# Patient Record
Sex: Female | Born: 1960
Health system: Southern US, Community
[De-identification: ages and names within clinical notes are randomized; demographics above are authoritative.]

## PROBLEM LIST (undated history)

## (undated) DIAGNOSIS — E119 Type 2 diabetes mellitus without complications: Secondary | ICD-10-CM

## (undated) DIAGNOSIS — F32A Depression, unspecified: Secondary | ICD-10-CM

## (undated) DIAGNOSIS — F329 Major depressive disorder, single episode, unspecified: Secondary | ICD-10-CM

## (undated) DIAGNOSIS — F419 Anxiety disorder, unspecified: Secondary | ICD-10-CM

---

## 2016-06-28 ENCOUNTER — Emergency Department
Admission: EM | Admit: 2016-06-28 | Discharge: 2016-06-29 | Disposition: A | Discharge status: Other Acute Inpatient Hospital | Payer: Self-pay | Attending: Emergency Medicine | Admitting: Emergency Medicine

## 2016-06-28 ENCOUNTER — Encounter: Payer: Self-pay | Admitting: Emergency Medicine

## 2016-06-28 DIAGNOSIS — R440 Auditory hallucinations: Secondary | ICD-10-CM

## 2016-06-28 DIAGNOSIS — F329 Major depressive disorder, single episode, unspecified: Secondary | ICD-10-CM | POA: Insufficient documentation

## 2016-06-28 DIAGNOSIS — F203 Undifferentiated schizophrenia: Secondary | ICD-10-CM

## 2016-06-28 DIAGNOSIS — Z79899 Other long term (current) drug therapy: Secondary | ICD-10-CM | POA: Insufficient documentation

## 2016-06-28 DIAGNOSIS — F29 Unspecified psychosis not due to a substance or known physiological condition: Secondary | ICD-10-CM

## 2016-06-28 HISTORY — DX: Major depressive disorder, single episode, unspecified: F32.9

## 2016-06-28 HISTORY — DX: Depression, unspecified: F32.A

## 2016-06-28 HISTORY — DX: Anxiety disorder, unspecified: F41.9

## 2016-06-28 LAB — SALICYLATE LEVEL: Salicylate Lvl: <4 mg/dL (ref 2.8–30.0)

## 2016-06-28 LAB — COMPREHENSIVE METABOLIC PANEL
ALBUMIN: 4.6 g/dL (ref 3.5–5.0)
ALK PHOS: 71 U/L (ref 38–126)
ALT: 39 U/L (ref 14–54)
AST: 30 U/L (ref 15–41)
Anion gap: 11 (ref 5–15)
BUN: 13 mg/dL (ref 6–20)
CALCIUM: 9.1 mg/dL (ref 8.9–10.3)
CO2: 23 mmol/L (ref 22–32)
CREATININE: 0.78 mg/dL (ref 0.44–1.00)
Chloride: 104 mmol/L (ref 101–111)
GFR calc Af Amer: >60 mL/min (ref >60)
GFR calc non Af Amer: >60 mL/min (ref >60)
GLUCOSE: 171 mg/dL — AB (ref 65–99)
Potassium: 3.9 mmol/L (ref 3.5–5.1)
SODIUM: 138 mmol/L (ref 135–145)
Total Bilirubin: 0.3 mg/dL (ref 0.3–1.2)
Total Protein: 8.2 g/dL — ABNORMAL HIGH (ref 6.5–8.1)

## 2016-06-28 LAB — URINE DRUG SCREEN, QUALITATIVE (ARMC ONLY)
Amphetamines, Ur Screen: NOT DETECTED
BARBITURATES, UR SCREEN: NOT DETECTED
Benzodiazepine, Ur Scrn: NOT DETECTED
CANNABINOID 50 NG, UR ~~LOC~~: NOT DETECTED
COCAINE METABOLITE, UR ~~LOC~~: NOT DETECTED
MDMA (Ecstasy)Ur Screen: NOT DETECTED
METHADONE SCREEN, URINE: NOT DETECTED
OPIATE, UR SCREEN: NOT DETECTED
Phencyclidine (PCP) Ur S: NOT DETECTED
Tricyclic, Ur Screen: POSITIVE — AB

## 2016-06-28 LAB — CBC
HEMATOCRIT: 39.9 % (ref 35.0–47.0)
HEMOGLOBIN: 13 g/dL (ref 12.0–16.0)
MCH: 26.9 pg (ref 26.0–34.0)
MCHC: 32.6 g/dL (ref 32.0–36.0)
MCV: 82.4 fL (ref 80.0–100.0)
Platelets: 249 10*3/uL (ref 150–440)
RBC: 4.84 MIL/uL (ref 3.80–5.20)
RDW: 14.1 % (ref 11.5–14.5)
WBC: 7.4 10*3/uL (ref 3.6–11.0)

## 2016-06-28 LAB — ACETAMINOPHEN LEVEL: Acetaminophen (Tylenol), Serum: <10 ug/mL — ABNORMAL LOW (ref 10–30)

## 2016-06-28 LAB — ETHANOL: Alcohol, Ethyl (B): <5 mg/dL (ref <5)

## 2016-06-28 MED ORDER — MIRTAZAPINE 15 MG PO TABS
30.0000 mg | ORAL_TABLET | Freq: Every day | ORAL | Status: DC
  Administered 2016-06-28: 30 mg via ORAL

## 2016-06-28 MED ORDER — QUETIAPINE FUMARATE 25 MG PO TABS
100.0000 mg | ORAL_TABLET | Freq: Three times a day (TID) | ORAL | Status: DC
  Administered 2016-06-28 – 2016-06-29 (×3): 100 mg via ORAL
  Filled 2016-06-28 (×2): qty 4

## 2016-06-28 MED ORDER — TRAZODONE HCL 100 MG PO TABS
100.0000 mg | ORAL_TABLET | Freq: Every day | ORAL | Status: DC
  Administered 2016-06-28: 100 mg via ORAL

## 2016-06-28 MED ORDER — FLUOXETINE HCL 20 MG PO CAPS
20.0000 mg | ORAL_CAPSULE | Freq: Every day | ORAL | Status: DC
  Administered 2016-06-29: 20 mg via ORAL
  Filled 2016-06-28: qty 1

## 2016-06-28 NOTE — ED Notes (Signed)

## 2016-06-28 NOTE — ED Provider Notes (Signed)
[facility] Emergency Department Provider Note   ____________________________________________  Time seen: Approximately 950 PM  I have reviewed the triage vital signs and the nursing notes.   HISTORY  Chief Complaint Paranoid   HPI Megan [name] is a 55 y.o. female with a history of anxiety and depression was present in the emergency department after being involuntarily [facility] for psychosis and hallucinations. The patient also had a suicide attempt in March when she tried to drink bleach. The patient says that she is also having command hallucinations that are telling her to kill herself. She says that she has been compliant with her medications.   Past Medical History  Diagnosis Date  . Anxiety   . Depression     There are no active problems to display for this patient.   History reviewed. No pertinent past surgical history.  No current outpatient prescriptions on file.  Allergies Sulfa antibiotics  No family history on file.  Social History Social History  Substance Use Topics  . Smoking status: Never Smoker   . Smokeless tobacco: None  . Alcohol Use: No    Review of Systems Constitutional: No fever/chills Eyes: No visual changes. ENT: No sore throat. Cardiovascular: Denies chest pain. Respiratory: Denies shortness of breath. Gastrointestinal: No abdominal pain.  No nausea, no vomiting.  No diarrhea.  No constipation. Genitourinary: Negative for dysuria. Musculoskeletal: Negative for back pain. Skin: Negative for rash. Neurological: Negative for headaches, focal weakness or numbness.  10-point ROS otherwise negative.  ____________________________________________   PHYSICAL EXAM:  VITAL SIGNS: ED Triage Vitals  Enc Vitals Group     BP 06/28/16 1901 152/90 mmHg     Pulse Rate 06/28/16 1901 90     Resp 06/28/16 1901 18     Temp --      Temp Source 06/28/16 1901 Oral     SpO2  06/28/16 1901 98 %     Weight 06/28/16 1901 176 lb (79.833 kg)     Height 06/28/16 1901 5\' 2"  (1.575 m)     Head Cir --      Peak Flow --      Pain Score 06/28/16 1902 0     Pain Loc --      Pain Edu? --      Excl. in GC? --     Constitutional: Alert and oriented. Well appearing and in no acute distress. Eyes: Conjunctivae are normal. PERRL. EOMI. Head: Atraumatic. Nose: No congestion/rhinnorhea. Mouth/Throat: Mucous membranes are moist.   Neck: No stridor.   Cardiovascular: Normal rate, regular rhythm. Grossly normal heart sounds.  Respiratory: Normal respiratory effort.  No retractions. Lungs CTAB. Gastrointestinal: Soft and nontender. No distention. No abdominal bruits. No CVA tenderness. Musculoskeletal: No lower extremity tenderness nor edema.  No joint effusions. Neurologic:  Normal speech and language. No gross focal neurologic deficits are appreciated.  Skin:  Skin is warm, dry and intact. No rash noted. Psychiatric: Mood and affect are normal. Speech and behavior are normal.  ____________________________________________   LABS (all labs ordered are listed, but only abnormal results are displayed)  Labs Reviewed  COMPREHENSIVE METABOLIC PANEL - Abnormal; Notable for the following:    Glucose, Bld 171 (*)    Total Protein 8.2 (*)    All other components within normal limits  ACETAMINOPHEN LEVEL - Abnormal; Notable for the following:    Acetaminophen (Tylenol), Serum <10 (*)    All other components within normal limits  URINE DRUG  SCREEN, QUALITATIVE ([facility] ONLY) - Abnormal; Notable for the following:    Tricyclic, Ur Screen POSITIVE (*)    All other components within normal limits  ETHANOL  SALICYLATE LEVEL  CBC   ____________________________________________  EKG   ____________________________________________  RADIOLOGY   ____________________________________________   PROCEDURES  ____________________________________________   INITIAL IMPRESSION /  ASSESSMENT AND PLAN / ED COURSE  Pertinent labs & imaging results that were available during my care of the patient were reviewed by me and considered in my medical decision making (see chart for details).  We will uphold involuntary commitment. Psychiatry to see. The patient is aware that she is committed and will need to stay overnight for psychiatry consult. ____________________________________________   FINAL CLINICAL IMPRESSION(S) / ED DIAGNOSES  Psychosis. Hallucinations.    NEW MEDICATIONS STARTED DURING THIS VISIT:  New Prescriptions   No medications on file     Note:  This document was prepared using Dragon voice recognition software and may include unintentional dictation errors.    Myrna Blazeravid Matthew Ladashia Demarinis, MD 06/28/16 2209

## 2016-06-28 NOTE — ED Notes (Addendum)
Arrives with [employer] under IVC paperwork.  [place] [facility].  States that people here in [place] are against her.  Patient is paranoid.  Denies SI/ HI

## 2016-06-28 NOTE — ED Notes (Signed)
Report to Margaret, RN in ED BHU.  

## 2016-06-28 NOTE — ED Notes (Signed)
BEHAVIORAL HEALTH ROUNDING  Patient sleeping: No.  Patient alert and oriented: yes  Behavior appropriate: Yes. ; If no, describe:  Nutrition and fluids offered: Yes  Toileting and hygiene offered: Yes  Sitter present: not applicable, Q 15 min safety rounds and observation.  Law enforcement present: Yes ODS  

## 2016-06-28 NOTE — ED Notes (Signed)
BEHAVIORAL HEALTH ROUNDING Patient sleeping: Yes.   Patient alert and oriented: not applicable SLEEPING Behavior appropriate: Yes.  ; If no, describe: SLEEPING Nutrition and fluids offered: No SLEEPING Toileting and hygiene offered: NoSLEEPING Sitter present: not applicable, Q 15 min safety rounds and observation. Law enforcement present: Yes ODS 
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
## 2016-06-28 NOTE — ED Notes (Signed)
Pt given home meds per MD Schaevitz.

## 2016-06-29 ENCOUNTER — Inpatient Hospital Stay
Admission: AD | Admit: 2016-06-29 | Discharge: 2016-07-05 | DRG: 885 | Disposition: A | Discharge status: Home / Self Care | Payer: No Typology Code available for payment source | Source: Ambulatory Visit | Attending: Psychiatry | Admitting: Psychiatry

## 2016-06-29 DIAGNOSIS — Z882 Allergy status to sulfonamides status: Secondary | ICD-10-CM

## 2016-06-29 DIAGNOSIS — F203 Undifferentiated schizophrenia: Secondary | ICD-10-CM | POA: Diagnosis not present

## 2016-06-29 DIAGNOSIS — Z79899 Other long term (current) drug therapy: Secondary | ICD-10-CM | POA: Diagnosis not present

## 2016-06-29 DIAGNOSIS — F22 Delusional disorders: Secondary | ICD-10-CM

## 2016-06-29 DIAGNOSIS — F4310 Post-traumatic stress disorder, unspecified: Secondary | ICD-10-CM | POA: Diagnosis present

## 2016-06-29 DIAGNOSIS — G4700 Insomnia, unspecified: Secondary | ICD-10-CM | POA: Diagnosis present

## 2016-06-29 DIAGNOSIS — Z9119 Patient's noncompliance with other medical treatment and regimen: Secondary | ICD-10-CM | POA: Diagnosis not present

## 2016-06-29 DIAGNOSIS — Z91419 Personal history of unspecified adult abuse: Secondary | ICD-10-CM | POA: Diagnosis not present

## 2016-06-29 DIAGNOSIS — F17200 Nicotine dependence, unspecified, uncomplicated: Secondary | ICD-10-CM | POA: Diagnosis present

## 2016-06-29 DIAGNOSIS — R45851 Suicidal ideations: Secondary | ICD-10-CM | POA: Diagnosis present

## 2016-06-29 DIAGNOSIS — Z915 Personal history of self-harm: Secondary | ICD-10-CM | POA: Diagnosis not present

## 2016-06-29 DIAGNOSIS — Z62810 Personal history of physical and sexual abuse in childhood: Secondary | ICD-10-CM | POA: Diagnosis present

## 2016-06-29 LAB — LIPID PANEL
Cholesterol: 255 mg/dL — ABNORMAL HIGH (ref 0–200)
HDL: 66 mg/dL (ref >40)
LDL CALC: 115 mg/dL — AB (ref 0–99)
TRIGLYCERIDES: 369 mg/dL — AB (ref <150)
Total CHOL/HDL Ratio: 3.9 RATIO
VLDL: 74 mg/dL — AB (ref 0–40)

## 2016-06-29 LAB — TSH: TSH: 0.275 u[IU]/mL — ABNORMAL LOW (ref 0.350–4.500)

## 2016-06-29 MED ORDER — TRAZODONE HCL 50 MG PO TABS
150.0000 mg | ORAL_TABLET | Freq: Every day | ORAL | Status: DC
  Administered 2016-06-29 – 2016-07-04 (×6): 150 mg via ORAL
  Filled 2016-06-29 (×6): qty 1

## 2016-06-29 MED ORDER — NICOTINE 21 MG/24HR TD PT24
21.0000 mg | MEDICATED_PATCH | Freq: Every day | TRANSDERMAL | Status: DC
  Administered 2016-06-30 (×2): 21 mg via TRANSDERMAL
  Filled 2016-06-29 (×4): qty 1

## 2016-06-29 MED ORDER — BISACODYL 10 MG RE SUPP
10.0000 mg | Freq: Once | RECTAL | Status: AC
  Administered 2016-06-29: 10 mg via RECTAL
  Filled 2016-06-29: qty 1

## 2016-06-29 MED ORDER — FLUOXETINE HCL 20 MG PO CAPS
20.0000 mg | ORAL_CAPSULE | Freq: Every day | ORAL | Status: DC
  Administered 2016-06-30 – 2016-07-01 (×2): 20 mg via ORAL
  Filled 2016-06-29 (×2): qty 1

## 2016-06-29 MED ORDER — NICOTINE 21 MG/24HR TD PT24
21.0000 mg | MEDICATED_PATCH | Freq: Every day | TRANSDERMAL | Status: DC
  Administered 2016-06-29: 21 mg via TRANSDERMAL
  Filled 2016-06-29: qty 1

## 2016-06-29 MED ORDER — ALUM & MAG HYDROXIDE-SIMETH 200-200-20 MG/5ML PO SUSP: 30.0000 mL | ORAL | Status: DC | PRN

## 2016-06-29 MED ORDER — PALIPERIDONE ER 3 MG PO TB24
9.0000 mg | ORAL_TABLET | Freq: Every day | ORAL | Status: DC
  Administered 2016-06-29 – 2016-07-04 (×6): 9 mg via ORAL
  Filled 2016-06-29 (×6): qty 3

## 2016-06-29 MED ORDER — ACETAMINOPHEN 325 MG PO TABS
650.0000 mg | ORAL_TABLET | Freq: Four times a day (QID) | ORAL | Status: DC | PRN
  Administered 2016-07-01: 650 mg via ORAL
  Filled 2016-06-29: qty 2

## 2016-06-29 MED ORDER — MAGNESIUM HYDROXIDE 400 MG/5ML PO SUSP: 30.0000 mL | Freq: Every day | ORAL | Status: DC | PRN

## 2016-06-29 NOTE — ED Notes (Signed)
ED BHU PLACEMENT JUSTIFICATION Is the patient under IVC or is there intent for IVC: Yes.   Is the patient medically cleared: Yes.   Is there vacancy in the ED BHU: Yes.   Is the population mix appropriate for patient: Yes.   Is the patient awaiting placement in inpatient or outpatient setting: Yes.   Has the patient had a psychiatric consult: Yes.   Survey of unit performed for contraband, proper placement and condition of furniture, tampering with fixtures in bathroom, shower, and each patient room: Yes.   APPEARANCE/BEHAVIOR calm, cooperative and adequate rapport can be established NEURO ASSESSMENT Orientation: place and person Hallucinations: Yes.  None noted (Hallucinations) Speech: Normal Gait: normal RESPIRATORY ASSESSMENT Normal expansion.  Clear to auscultation.  No rales, rhonchi, or wheezing. CARDIOVASCULAR ASSESSMENT regular rate and rhythm, S1, S2 normal, no murmur, click, rub or gallop GASTROINTESTINAL ASSESSMENT soft, nontender, BS WNL, no r/g EXTREMITIES normal strength, tone, and muscle mass PLAN OF CARE Provide calm/safe environment. Vital signs assessed twice daily. ED BHU Assessment once each 12-hour shift. Collaborate with intake RN daily or as condition indicates. Assure the ED provider has rounded once each shift. Provide and encourage hygiene. Provide redirection as needed. Assess for escalating behavior; address immediately and inform ED provider.  Assess family dynamic and appropriateness for visitation as needed: Yes.   Educate the patient/family about BHU procedures/visitation: Yes.

## 2016-06-29 NOTE — ED Notes (Signed)
Dr. Ardyth HarpsHernandez is talking with patient.

## 2016-06-29 NOTE — ED Notes (Signed)
Report was received from Dorise HissElizabeth C., RN; Pt. Verbalizes no complaints or distress; verbalizes having S.I.; denies having Hi. Presents with being paranoid; anxious; and depressed; and has been off her medications; secondary to, not being able to afford them; was referred here under IVC; per [facility]. Continue to monitor with 15 min. Monitoring.
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
## 2016-06-29 NOTE — ED Provider Notes (Signed)
-----------------------------------------   6:43 AM on 06/29/2016 -----------------------------------------   Blood pressure 128/90, pulse 95, temperature 98.8 F (37.1 C), temperature source Oral, resp. rate 18, height 5\' 2"  (1.575 m), weight 79.833 kg, last menstrual period 03/28/2016, SpO2 98 %.  The patient had no acute events since last update.  Calm and cooperative at this time.  Disposition is pending per Psychiatry/Behavioral Medicine team recommendations.     Loleta Roseory Jasmond River, MD 06/29/16 [phone]

## 2016-06-29 NOTE — ED Notes (Addendum)
Patient is alert, she ask for more food, nurse did give her a sandwich, and drink when she got her medications. Patient denies Si/Hi or avh. Patient is safe, no evidence of distress at this time. Camera surveillance in progress.

## 2016-06-29 NOTE — Progress Notes (Signed)
D: Pt denies SI/HI/AVH, Pt is pleasant and cooperative, affect is flat, but brightens upon approach.Pt appears less anxious and she is interacting with peers and staff appropriately.  A: Pt was offered support and encouragement. Pt was given scheduled medications. Pt was encouraged to attend groups. Q 15 minute checks were done for safety.  R:Pt attends groups and interacts well with peers and staff. Pt is taking medication. Pt has no complaints.Pt receptive to treatment and safety maintained on unit.

## 2016-06-29 NOTE — ED Notes (Signed)
Patient is alert, she has rapid speech , but is pleasant, denies Si.Hi and AVH. Patient is watching tv.

## 2016-06-29 NOTE — Progress Notes (Signed)
[facility] by Dr. Ardyth HarpsHernandez.  Attending Physician will be Dr. Ardyth HarpsHernandez.   Patient has [facility] room 304[facility]A, [facility] [place].   Intake Paper Work has been signed and placed on patient chart.  ER staff is aware of the admission Glendon Axe( [facility] Sect.; Dr. Fanny BienQuale, ER MD; Toniann FailWendy Patient's Nurse & Marliss CzarLeigh Patient Access).

## 2016-06-29 NOTE — ED Notes (Signed)
Called report to Mayra Neerliff parker RN , [facility]

## 2016-06-29 NOTE — [facility] Group Notes (Signed)
[facility] Group Notes:  (Nursing/MHT/Case Management/Adjunct)  Date:  06/29/2016  Time:  9:59 PM  Type of Therapy:  Evening Wrap-up Group  Participation Level:  Minimal  Participation Quality:  Appropriate and Attentive  Affect:  Appropriate  Cognitive:  Alert and Appropriate  Insight:  Good  Engagement in Group:  Developing/Improving and Engaged  Modes of Intervention:  Discussion  Summary of Progress/Problems:  Tomasita MorrowChelsea Nanta Laruth Hanger 06/29/2016, 9:59 PM

## 2016-06-29 NOTE — ED Notes (Signed)
Patient with discharge orders, [facility], patient voices understanding of transfer, patient transferred via w/c with police custody and nurse, Patient's belongings taken with patient.

## 2016-06-29 NOTE — ED Notes (Signed)
Patient had ask for sandwich, and her medications, nurse did administered medications earlier from scheduled time, patient is alert, but paranoid, talking about how people are stalking her and causing her not to be able to get a job, and how the government had put chemicals under her house at one time, she is pleasant to staff, no hostility noted, and is cooperative, paranoia is all about work, [employer] and people she use to work with and how she is taking them to court. Patient denies Si/HI, but states she does hear voices at times, but not at this present moment. Patient is safe, q 15 min. Checks, and camera surveillance in progress.

## 2016-06-29 NOTE — Progress Notes (Addendum)
Pt was admitted to beh. Med. With the Dx. Of Delusional DO. According to report: Pt presented to the  emergency department, talking about how people are stalking her and causing her not to be able to get a job, and how the government had put chemicals under her house at one time. patient had reported being off her medications due to inability to afford them. Patient says she has been having auditory hallucinations that tell her she is a bad mother, that she cannot take care of herself and that she is no good. She feels the devil is the one talking to her. She also sees black shadows sometimes. The patient has been having thoughts about killing herself but denies having any intention to do it. She did  however take an overdose of her medications about 7 days ago. Patient  states  she took 2 bottles of her medications 7 days ago in a suicidal attempt. Pt was pleasant and cooperative doing the 1:!. Pt denies SI and A/V hallucinations, but does endorse having them in the past. Pt is able to contract for safety. Pt. Talked about the events leading to hospitalization.

## 2016-06-29 NOTE — ED Notes (Signed)
Patient had ask for suppository for constipation, MD doctor did order, patient is alert, denies Si/Hi or avh at this time, camera surveillance and q 15 min. Checks.

## 2016-06-29 NOTE — Tx Team (Signed)
Initial Interdisciplinary Treatment Plan   PATIENT STRESSORS: Financial difficulties Medication change or noncompliance   PATIENT STRENGTHS: Motivation for treatment/growth Supportive family/friends   PROBLEM LIST: Problem List/Patient Goals Date to be addressed Date deferred Reason deferred Estimated date of resolution  depression 06/29/16     Suicidal ideations 06/29/16                                                DISCHARGE CRITERIA:  Ability to meet basic life and health needs Improved stabilization in mood, thinking, and/or behavior Motivation to continue treatment in a less acute level of care  PRELIMINARY DISCHARGE PLAN: Attend aftercare/continuing care group Outpatient therapy  PATIENT/FAMIILY INVOLVEMENT: This treatment plan has been presented to and reviewed with the patient, Megan [name],The patient and family have been given the opportunity to ask questions and make suggestions.  Hoover BrunetteClifton B Nyla Creason 06/29/2016, 7:24 PM

## 2016-06-30 DIAGNOSIS — F22 Delusional disorders: Secondary | ICD-10-CM

## 2016-06-30 LAB — HEMOGLOBIN A1C: Hgb A1c MFr Bld: 5.8 % (ref 4.0–6.0)

## 2016-06-30 NOTE — [facility] Group Notes (Signed)
[facility] LCSW Group Therapy   06/30/2016 11 AM   Type of Therapy: Group Therapy   Participation Level: Active   Participation Quality: Attentive, Sharing and Supportive   Affect: Appropriate   Cognitive: Alert and Oriented   Insight: Developing/Improving and Engaged   Engagement in Therapy: Developing/Improving and Engaged   Modes of Intervention: Clarification, Confrontation, Discussion, Education, Exploration, Limit-setting, Orientation, Problem-solving, Rapport Building, Dance movement psychotherapisteality Testing, Socialization and Support    Summary of Progress/Problems: The topic for today was reviewing feelings about relapse, per yesterdays group. Pt discussed what relapse prevention is to them and identified triggers that they are on the path to relapse. Pt processed their feeling towards relapse and was able to relate to peers. Pt discussed coping skills that can be used for relapse prevention. Pt shared the pt copes with difficulties by focusing on the pt's ability to overcome obstacles.  Pt shared that by focusing on the pt's good qualities in others the pt can assist in preventing relapse.  Pt shares that for the pt coming to the hospital means relapse and that triggers for relapse for the pt are seeing others in a negative light.  Pt was polite and cooperative with the CSW and other group members and focused and attentive to the topics discussed and the sharing of others.   Dorothe PeaJonathan F. Reginal Wojcicki, MSW, LCSWA, LCAS
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
## 2016-06-30 NOTE — BHH Group Notes (Signed)
BHH Group Notes:  (Nursing/MHT/Case Management/Adjunct)  Date:  06/30/2016  Time:  3:16 PM  Type of Therapy:  Psychoeducational Skills  Participation Level:  Active  Participation Quality:  Appropriate, Attentive and Sharing  Affect:  Appropriate  Cognitive:  Alert and Appropriate  Insight:  Appropriate  Engagement in Group:  Engaged  Modes of Intervention:  Discussion, Education and Support  Summary of Progress/Problems:  Lynelle SmokeCara Travis Rechel Delosreyes 06/30/2016, 3:16 PM

## 2016-06-30 NOTE — Progress Notes (Signed)
Pt has been pleasant and cooperative. Pt's mood and affect has been depressed. Pt denies SI and A/V hallucinations. Pt has attended all unit activities. Pt has been interacting appropriately on the unit, no delusional or paranoid behaviors noted. Pt appears to be adjusting well on the unit.   
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
## 2016-06-30 NOTE — BHH Suicide Risk Assessment (Signed)
[facility]   Nursing information obtained from:    EMR, nursing, patient Demographic factors:    female, AA Current Mental Status:    delusional Loss Factors:    Historical Factors:    Risk Reduction Factors:    desire to improve   Total Time spent with patient: 1 hour Principal Problem: <principal problem not specified> Diagnosis:   Patient Active Problem List   Diagnosis Date Noted  . Delusional disorder (HCC) [F22] 06/29/2016  . MDD (major depressive disorder), recurrent episode, moderate (HCC) [F33.1] 06/29/2016  . Tobacco use disorder [F17.200] 06/29/2016   Subjective Data: delusional disorder IVC'd  Continued Clinical Symptoms:  Alcohol Use Disorder Identification Test Final Score (AUDIT): 5 The "Alcohol Use Disorders Identification Test", Guidelines for Use in Primary Care, Second Edition.  World Science writerHealth Organization [facility](WHO). Score between 0-7:  no or low risk or alcohol related problems. Score between 8-15:  moderate risk of alcohol related problems. Score between 16-19:  high risk of alcohol related problems. Score 20 or above:  warrants further diagnostic evaluation for alcohol dependence and treatment.   CLINICAL FACTORS:   Currently Psychotic Previous Psychiatric Diagnoses and Treatments   Musculoskeletal: Strength & Muscle Tone: within normal limits Gait & Station: normal Patient leans: N/A  Psychiatric Specialty Exam: Physical Exam  ROS Psychiatric/Behavioral: Positive for depression, suicidal ideas and hallucinations. The patient is nervous/anxious and has insomnia.   Blood pressure 107/90, pulse 94, temperature 98.5 F (36.9 C), temperature source Oral, resp. rate 18, height 5\' 5"  (1.651 m), weight 80.74 kg (178 lb), last menstrual period 03/28/2016, SpO2 99 %.Body mass index is 29.62 kg/(m^2).    General Appearance: Disheveled  Eye Contact: Good  Speech: Clear and Coherent  Volume: Normal  Mood: Anxious and Depressed   Affect: Constricted, Depressed, Labile and Tearful  Thought Process: Disorganized  Orientation: Full (Time, Place, and Person)  Thought Content: Hallucinations: Auditory  Suicidal Thoughts: No  Homicidal Thoughts: No  Memory: Immediate; Fair Recent; Fair Remote; Fair  Judgement: Impaired  Insight: Shallow  Psychomotor Activity: Decreased  Concentration: Concentration: Fair and Attention Span: Fair  Recall: FiservFair  Fund of Knowledge: Fair  Language: Fair  Akathisia: No  Handed: Right  AIMS (if indicated):    Assets: Engineer, maintenanceCommunication Skills Housing Physical Health Social Support  ADL's: Intact  Cognition: WNL  Sleep:            COGNITIVE FEATURES THAT CONTRIBUTE TO RISK:  Closed-mindedness    SUICIDE RISK:   Mild:  Suicidal ideation of limited frequency, intensity, duration, and specificity.  There are no identifiable plans, no associated intent, mild dysphoria and related symptoms, reasonable self-control (both objective and subjective assessment), few other risk factors, and identifiable protective factors, including available and accessible social support.  PLAN OF CARE: as per HP   For delusional disorder she will be started on Invega 9 mg by mouth daily at bedtime  For major depressive disorder the patient will be continued on fluoxetine 20 mg a day  For insomnia patient will be continued on trazodone but I will increase the dose to 150 mg by mouth daily at bedtime.  For tobacco use disorder will order nicotine patch 21 mg a day.  Diet low sodium as blood pressures currently elevated  Precautions every 15 minute checks  Hospitalization and status involuntary commitment  order hemoglobin A 1C, lipid panel, prolactin level and TSH. - Hgb A1c 5.8, Low TSH- 0.275, No result for prolactin yet.   I certify  that inpatient services furnished can reasonably be expected to improve the patient's condition.   Lockie ParesBell,   Giulliana Mcroberts L, MD 06/30/2016, 3:32 PM

## 2016-07-01 ENCOUNTER — Ambulatory Visit: Payer: Self-pay

## 2016-07-01 DIAGNOSIS — F203 Undifferentiated schizophrenia: Principal | ICD-10-CM

## 2016-07-01 LAB — PROLACTIN: PROLACTIN: 26.2 ng/mL — AB (ref 4.8–23.3)

## 2016-07-01 MED ORDER — PALIPERIDONE PALMITATE 234 MG/1.5ML IM SUSP
234.0000 mg | INTRAMUSCULAR | Status: DC
  Administered 2016-07-01: 234 mg via INTRAMUSCULAR
  Filled 2016-07-01: qty 1.5

## 2016-07-01 MED ORDER — VENLAFAXINE HCL ER 75 MG PO CP24
150.0000 mg | ORAL_CAPSULE | Freq: Every day | ORAL | Status: DC
  Administered 2016-07-02 – 2016-07-05 (×4): 150 mg via ORAL
  Filled 2016-07-01 (×4): qty 2

## 2016-07-01 NOTE — Progress Notes (Signed)
D: Pt denies SI/HI/AVH. Pt is pleasant and cooperative affect is much brighter, thoughts are organized no bizarre behavior noted. Pt  appears less anxious and she is interacting with peers and staff appropriately.  A: Pt was offered support and encouragement. Pt was given scheduled medications. Pt was encouraged to attend groups. Q 15 minute checks were done for safety.  R:Pt attends groups and interacts well with peers and staff. Pt is taking medication. Pt has no complaints.Pt receptive to treatment and safety maintained on unit.

## 2016-07-01 NOTE — BHH Group Notes (Signed)
BHH Group Notes:  (Nursing/MHT/Case Management/Adjunct)  Date:  07/01/2016  Time:  4:15 PM  Type of Therapy:  Psychoeducational Skills  Participation Level:  Active  Participation Quality:  Appropriate  Affect:  Appropriate and Tearful  Cognitive:  Appropriate  Insight:  Appropriate  Engagement in Group:  Engaged  Modes of Intervention:  Discussion and Education  Summary of Progress/Problems:  Megan [name] M [name] 07/01/2016, 4:15 PM

## 2016-07-01 NOTE — Progress Notes (Signed)
[facility]  07/01/2016 11:15 AM Mindi JunkerShelia Un  MRN:  [id number]  Subjective:  Mrs. Megan [name] is very depressed, anxious, and suicidal. She still hallucinates and has severe paranoia. She is extremely tearful on interview at this is slightly pressured and disorganized. She accepts medications and tolerates them well. There are no somatic complaints. Her sleep improved. Appetite is okay. She participates in programming.  Principal Problem: Schizophrenia, undifferentiated (HCC) Diagnosis:   Patient Active Problem List   Diagnosis Date Noted  . Schizophrenia, undifferentiated (HCC) [F20.3] 06/29/2016  . MDD (major depressive disorder), recurrent episode, moderate (HCC) [F33.1] 06/29/2016   Total Time spent with patient: 20 minutes  Past Psychiatric History: Schizophrenia.  Past Medical History:  Past Medical History  Diagnosis Date  . Anxiety   . Depression    History reviewed. No pertinent past surgical history. Family History: History reviewed. No pertinent family history. Family Psychiatric  History: See H&P. Social History:  History  Alcohol Use No     History  Drug Use Not on file    Social History   Social History  . Marital Status: Married    Spouse Name: N/A  . Number of Children: N/A  . Years of Education: N/A   Social History Main Topics  . Smoking status: Never Smoker   . Smokeless tobacco: None  . Alcohol Use: No  . Drug Use: None  . Sexual Activity: Not Asked   Other Topics Concern  . None   Social History Narrative   Additional Social History:                         Sleep: Fair  Appetite:  Fair  Current Medications: Current Facility-Administered Medications  Medication Dose Route Frequency Provider Last Rate Last Dose  . acetaminophen (TYLENOL) tablet 650 mg  650 mg Oral Q6H PRN Jimmy FootmanAndrea Hernandez-Gonzalez, MD   650 mg at 07/01/16 [id number]  . alum & mag hydroxide-simeth (MAALOX/MYLANTA) 200-200-20 MG/5ML suspension 30 mL  30 mL  Oral Q4H PRN Jimmy FootmanAndrea Hernandez-Gonzalez, MD      . magnesium hydroxide (MILK OF MAGNESIA) suspension 30 mL  30 mL Oral Daily PRN Jimmy FootmanAndrea Hernandez-Gonzalez, MD      . nicotine (NICODERM CQ - dosed in mg/24 hours) patch 21 mg  21 mg Transdermal Daily Jimmy FootmanAndrea Hernandez-Gonzalez, MD   21 mg at 06/30/16 0810  . paliperidone (INVEGA SUSTENNA) injection 234 mg  234 mg Intramuscular Q28 days Shelby Peltz B Farah Benish, MD      . paliperidone (INVEGA) 24 hr tablet 9 mg  9 mg Oral QHS Jimmy FootmanAndrea Hernandez-Gonzalez, MD   9 mg at 06/30/16 2149  . traZODone (DESYREL) tablet 150 mg  150 mg Oral QHS Jimmy FootmanAndrea Hernandez-Gonzalez, MD   150 mg at 06/30/16 2149  . [START ON 07/02/2016] venlafaxine XR (EFFEXOR-XR) 24 hr capsule 150 mg  150 mg Oral Q breakfast Agnes Brightbill B Capone Schwinn, MD        Lab Results:  Results for orders placed or performed during the hospital encounter of 06/29/16 (from the past 48 hour(s))  Hemoglobin A1c     Status: None   Collection Time: 06/29/16  7:24 PM  Result Value Ref Range   Hgb A1c MFr Bld 5.8 4.0 - 6.0 %  Lipid panel, fasting     Status: Abnormal   Collection Time: 06/29/16  7:24 PM  Result Value Ref Range   Cholesterol 255 (H) 0 - 200 mg/dL   Triglycerides 119369 (H) <150 mg/dL  HDL 66 >40 mg/dL   Total CHOL/HDL Ratio 3.9 RATIO   VLDL 74 (H) 0 - 40 mg/dL   LDL Cholesterol 657 (H) 0 - 99 mg/dL    Comment:        Total Cholesterol/HDL:CHD Risk Coronary Heart Disease Risk Table                     Men   Women  1/2 Average Risk   3.4   3.3  Average Risk       5.0   4.4  2 X Average Risk   9.6   7.1  3 X Average Risk  23.4   11.0        Use the calculated Patient Ratio above and the CHD Risk Table to determine the patient's CHD Risk.        ATP III CLASSIFICATION (LDL):  <100     mg/dL   Optimal  846-962  mg/dL   Near or Above                    Optimal  130-159  mg/dL   Borderline  952-841  mg/dL   High  >324     mg/dL   Very High   Prolactin     Status: Abnormal   Collection  Time: 06/29/16  7:24 PM  Result Value Ref Range   Prolactin 26.2 (H) 4.8 - 23.3 ng/mL    Comment: (NOTE) Performed At: [facility] [address] [place], [place] [id number] Mila Homer MD [id number]   TSH     Status: Abnormal   Collection Time: 06/29/16  7:24 PM  Result Value Ref Range   TSH 0.275 (L) 0.350 - 4.500 uIU/mL    Blood Alcohol level:  Lab Results  Component Value Date   ETH <5 06/28/2016    Metabolic Disorder Labs: Lab Results  Component Value Date   HGBA1C 5.8 06/29/2016   Lab Results  Component Value Date   PROLACTIN 26.2* 06/29/2016   Lab Results  Component Value Date   CHOL 255* 06/29/2016   TRIG 369* 06/29/2016   HDL 66 06/29/2016   CHOLHDL 3.9 06/29/2016   VLDL 74* 06/29/2016   LDLCALC 115* 06/29/2016    Physical Findings: AIMS: Facial and Oral Movements Muscles of Facial Expression: None, normal Lips and Perioral Area: None, normal Jaw: None, normal Tongue: None, normal,Extremity Movements Upper (arms, wrists, hands, fingers): None, normal Lower (legs, knees, ankles, toes): None, normal, Trunk Movements Neck, shoulders, hips: None, normal, Overall Severity Severity of abnormal movements (highest score from questions above): None, normal Incapacitation due to abnormal movements: None, normal, Dental Status Current problems with teeth and/or dentures?: No Does patient usually wear dentures?: No  CIWA:  CIWA-Ar Total: 0 COWS:  COWS Total Score: 0  Musculoskeletal: Strength & Muscle Tone: within normal limits Gait & Station: normal Patient leans: N/A  Psychiatric Specialty Exam: Physical Exam  Nursing note and vitals reviewed.   Review of Systems  Psychiatric/Behavioral: Positive for depression and hallucinations. The patient is nervous/anxious and has insomnia.   All other systems reviewed and are negative.   Blood pressure 123/86, pulse 88, temperature 98.8 F (37.1 C), temperature source Oral, resp. rate 18,  height  (1.651 m), weight 80.74 kg (178 lb), last menstrual period 03/28/2016, SpO2 99 %.Body mass index is 29.62 kg/(m^2).  General Appearance: Casual  Eye Contact:  Good  Speech:  Pressured  Volume:  Increased  Mood:  Anxious, Depressed, Hopeless and Worthless  Affect:  Labile and Tearful  Thought Process:  Disorganized  Orientation:  Full (Time, Place, and Person)  Thought Content:  Illogical, Delusions, Hallucinations: Auditory Command:  Telling her to hurt herself. and Paranoid Ideation  Suicidal Thoughts:  Yes.  with intent/plan  Homicidal Thoughts:  No  Memory:  Immediate;   Fair Recent;   Fair Remote;   Fair  Judgement:  Poor  Insight:  Shallow  Psychomotor Activity:  Normal  Concentration:  Concentration: Fair and Attention Span: Fair  Recall:  F[employer]Fair  Fund of Knowledge:  Fair  Language:  Fair  Akathisia:  No  Handed:  Right  AIMS (if indicated):     Assets:  Communication Skills Desire for Improvement Housing Physical Health Resilience Social Support  ADL's:  Intact  Cognition:  WNL  Sleep:  Number of Hours: 6.75     Treatment Plan Summary: Daily contact with patient to assess and evaluate symptoms and progress in treatment and Medication management   [name] is a 55 year old female with a history of depression, anxiety, suicide attempts psychosis and mood is ability admitted for worsening of depression and psychosis in the context of treatment noncompliance and severe social stressors.   1. Suicidal ideation. The patient is able to contract for safety in the hospital.    2. Mood and psychosis. We will discontinue Prozac and start Effexor for depression. She was started on oral Invega. We'll offer Invega Sustenna injection to improve compliance.   3. Insomnia. Trazodone is available.   4. Smoking. Nicotine patch is available.  5. Metabolic syndromes screening. Lipid panel and hemoglobin A1c are normal. Prolactin is 26.   6. Low TSH. Will order a  free T4.   7. PTSD. We will consider Minipress.   8. Disposition. She will be discharged to home with her sister. She will follow up with [facility].  I certify that inpatient services furnished can reasonably be expected to improve the patient's condition.   Kristine LineaJolanta Marleen Moret, MD 07/01/2016, 11:15 AM

## 2016-07-01 NOTE — Progress Notes (Signed)
Recreation Therapy Notes  Date: 07.03.17 Time: 9:30 am Location: Craft Room  Group Topic: Self-expression  Goal Area(s) Addresses:  Patient will identify one color per emotion listed on wheel. Patient will verbalize one emotion experienced during group. Patient will be educated on different forms of self-expression.  Behavioral Response: Attentive, Interactive  Intervention: Emotion Wheel  Activity: Patients were given an emotion wheel worksheet and instructed to pick a color for each emotion listed on the wheel.  Education: LRT educated patients on different forms of self-expression.  Education Outcome: Acknowledges education/In group clarification offered   Clinical Observations/Feedback: Patient completed activity by picking a color for each emotion. Patient contributed to group discussion by stating what colors she picked for each emotion, and what emotions she felt while she was coloring.  Jacquelynn CreeGreene,Teri Diltz M, LRT/CTRS 07/01/2016 10:28 AM

## 2016-07-01 NOTE — Progress Notes (Signed)
D:  Patient denies HI/SI.  Patient interacts with staff and peers appropriately.  Patient is cooperative with plan of care and appears to be less anxious. A:  Support and encouragement was offered to patient and patient was receptive.  Scheduled medication given.  Q15 minute checks continue for safety. R:  Patient continues to attend group and remains compliant with medication.  Patient interacts well with staff and peers and continues to be receptive to treatment.  Patient set a goal to remain focused and is actively working towards her goal.

## 2016-07-01 NOTE — Plan of Care (Signed)
Problem: Coping: Goal: Ability to cope will improve Outcome: Progressing Calm environment provided.  Patient continues to show improvement with coping skills and states that reading helps her to cope.

## 2016-07-02 LAB — T4, FREE: FREE T4: 0.83 ng/dL (ref 0.61–1.12)

## 2016-07-02 MED ORDER — PRAZOSIN HCL 2 MG PO CAPS
2.0000 mg | ORAL_CAPSULE | Freq: Two times a day (BID) | ORAL | Status: DC
  Administered 2016-07-02 – 2016-07-05 (×7): 2 mg via ORAL
  Filled 2016-07-02 (×7): qty 1

## 2016-07-02 NOTE — [facility] Group Notes (Signed)
[facility] Group Notes:  (Nursing/MHT/Case Management/Adjunct)  Date:  07/02/2016  Time:  3:33 AM  Type of Therapy:  Psychoeducational Skills  Participation Level:  Active  Participation Quality:  Appropriate  Affect:  Appropriate  Cognitive:  Appropriate  Insight:  Appropriate and Good  Engagement in Group:  Engaged and Supportive  Modes of Intervention:  Discussion, Socialization and Support  Summary of Progress/Problems:  Chancy MilroyLaquanda Y Renell [name] 07/02/2016, 3:33 AM

## 2016-07-02 NOTE — Progress Notes (Signed)
[facility]  07/02/2016 12:01 PM Megan [name]  MRN:  [id number]  Subjective:  Megan [name] is still delusional but more upbeat today. She has not been crying. She was able to participate in treatment team today and have a discussion about discharge planning. She accepted [place] injection yesterday they but complains of arm pain and does not want to continue on injections. She is ready to take pills. She however has no health insurance and it is unclear if she will be able to afford it. Sleep and appetite are good. Good group participation.  Principal Problem: Schizophrenia, undifferentiated (HCC) Diagnosis:   Patient Active Problem List   Diagnosis Date Noted  . Schizophrenia, undifferentiated (HCC) [F20.3] 06/29/2016  . MDD (major depressive disorder), recurrent episode, moderate (HCC) [F33.1] 06/29/2016   Total Time spent with patient: 20 minutes  Past Psychiatric History: Schizophrenia.  Past Medical History:  Past Medical History  Diagnosis Date  . Anxiety   . Depression    History reviewed. No pertinent past surgical history. Family History: History reviewed. No pertinent family history. Family Psychiatric  History: See H&P. Social History:  History  Alcohol Use No     History  Drug Use Not on file    Social History   Social History  . Marital Status: Married    Spouse Name: N/A  . Number of Children: N/A  . Years of Education: N/A   Social History Main Topics  . Smoking status: Never Smoker   . Smokeless tobacco: None  . Alcohol Use: No  . Drug Use: None  . Sexual Activity: Not Asked   Other Topics Concern  . None   Social History Narrative   Additional Social History:                         Sleep: Fair  Appetite:  Fair  Current Medications: Current Facility-Administered Medications  Medication Dose Route Frequency Provider Last Rate Last Dose  . acetaminophen (TYLENOL) tablet 650 mg  650 mg Oral Q6H PRN Jimmy FootmanAndrea  Hernandez-Gonzalez, MD   650 mg at 07/01/16 [id number]  . alum & mag hydroxide-simeth (MAALOX/MYLANTA) 200-200-20 MG/5ML suspension 30 mL  30 mL Oral Q4H PRN Jimmy FootmanAndrea Hernandez-Gonzalez, MD      . magnesium hydroxide (MILK OF MAGNESIA) suspension 30 mL  30 mL Oral Daily PRN Jimmy FootmanAndrea Hernandez-Gonzalez, MD      . nicotine (NICODERM CQ - dosed in mg/24 hours) patch 21 mg  21 mg Transdermal Daily Jimmy FootmanAndrea Hernandez-Gonzalez, MD   21 mg at 06/30/16 0810  . paliperidone (INVEGA SUSTENNA) injection 234 mg  234 mg Intramuscular Q28 days Shari ProwsJolanta B Maralee Higuchi, MD   234 mg at 07/01/16 1222  . paliperidone (INVEGA) 24 hr tablet 9 mg  9 mg Oral QHS Jimmy FootmanAndrea Hernandez-Gonzalez, MD   9 mg at 07/01/16 2143  . traZODone (DESYREL) tablet 150 mg  150 mg Oral QHS Jimmy FootmanAndrea Hernandez-Gonzalez, MD   150 mg at 07/01/16 2143  . venlafaxine XR (EFFEXOR-XR) 24 hr capsule 150 mg  150 mg Oral Q breakfast Averie Hornbaker B Younes Degeorge, MD   150 mg at 07/02/16 0810    Lab Results: No results found for this or any previous visit (from the past 48 hour(s)).  Blood Alcohol level:  Lab Results  Component Value Date   ETH <5 06/28/2016    Metabolic Disorder Labs: Lab Results  Component Value Date   HGBA1C 5.8 06/29/2016   Lab Results  Component Value Date  PROLACTIN 26.2* 06/29/2016   Lab Results  Component Value Date   CHOL 255* 06/29/2016   TRIG 369* 06/29/2016   HDL 66 06/29/2016   CHOLHDL 3.9 06/29/2016   VLDL 74* 06/29/2016   LDLCALC 115* 06/29/2016    Physical Findings: AIMS: Facial and Oral Movements Muscles of Facial Expression: None, normal Lips and Perioral Area: None, normal Jaw: None, normal Tongue: None, normal,Extremity Movements Upper (arms, wrists, hands, fingers): None, normal Lower (legs, knees, ankles, toes): None, normal, Trunk Movements Neck, shoulders, hips: None, normal, Overall Severity Severity of abnormal movements (highest score from questions above): None, normal Incapacitation due to abnormal  movements: None, normal, Dental Status Current problems with teeth and/or dentures?: No Does patient usually wear dentures?: No  CIWA:  CIWA-Ar Total: 0 COWS:  COWS Total Score: 0  Musculoskeletal: Strength & Muscle Tone: within normal limits Gait & Station: normal Patient leans: N/A  Psychiatric Specialty Exam: Physical Exam  Nursing note and vitals reviewed.   Review of Systems  Psychiatric/Behavioral: Positive for depression, suicidal ideas and hallucinations.  All other systems reviewed and are negative.   Blood pressure 124/76, pulse 86, temperature 98.2 F (36.8 C), temperature source Oral, resp. rate 18, height 5\' 5"  (1.651 m), weight 80.74 kg (178 lb), last menstrual period 03/28/2016, SpO2 99 %.Body mass index is 29.62 kg/(m^2).  General Appearance: Casual  Eye Contact:  Good  Speech:  Clear and Coherent  Volume:  Normal  Mood:  Anxious  Affect:  Appropriate  Thought Process:  Disorganized  Orientation:  Full (Time, Place, and Person)  Thought Content:  Illogical, Delusions and Paranoid Ideation  Suicidal Thoughts:  No  Homicidal Thoughts:  No  Memory:  Immediate;   Fair Recent;   Fair Remote;   Fair  Judgement:  Impaired  Insight:  Lacking  Psychomotor Activity:  Normal  Concentration:  Concentration: Fair and Attention Span: Fair  Recall:  FiservFair  Fund of Knowledge:  Fair  Language:  Fair  Akathisia:  No  Handed:  Right  AIMS (if indicated):     Assets:  Communication Skills Desire for Improvement Financial Resources/Insurance Housing Physical Health Resilience Social Support  ADL's:  Intact  Cognition:  WNL  Sleep:  Number of Hours: 7     Treatment Plan Summary: Daily contact with patient to assess and evaluate symptoms and progress in treatment and Medication management   Megan [name] is a 55 year old female with a history of depression, anxiety, suicide attempts psychosis and mood is ability admitted for worsening of depression and psychosis  in the context of treatment noncompliance and severe social stressors.   1. Suicidal ideation. The patient is able to contract for safety in the hospital.   2. Mood and psychosis. We will discontinue Prozac and start Effexor for depression. She was started on oral Invega. The patient refuses second Invega injection.   3. Insomnia. Trazodone is available.   4. Smoking. Nicotine patch is available.  5. Metabolic syndromes screening. Lipid panel and hemoglobin A1c are normal. Prolactin is 26.   6. Low TSH. Will order a free T4.   7. PTSD. We offered Minipress.   8. Disposition. She will be discharged to home with her sister. She will follow up with [facility].  Kristine LineaJolanta Jaiquan Temme, MD 07/02/2016, 12:01 PM

## 2016-07-02 NOTE — [facility] Suicide Risk Assessment (Signed)
[facility] INPATIENT:  Family/Significant Other Suicide Prevention Education  Suicide Prevention Education:  Education Completed; Arn Medaloris Owens, Sister,  (name of family member/significant other) has been identified by the patient as the family member/significant other with whom the patient will be residing, and identified as the person(s) who will aid the patient in the event of a mental health crisis (suicidal ideations/suicide attempt).  With written consent from the patient, the family member/significant other has been provided the following suicide prevention education, prior to the and/or following the discharge of the patient.  The suicide prevention education provided includes the following:  Suicide risk factors  Suicide prevention and interventions  National Suicide Hotline telephone number  [facility] assessment telephone number  [facility] Emergency Assistance 911  [facility] and/or Residential Mobile Crisis Unit telephone number  Request made of family/significant other to:  Remove weapons (e.g., guns, rifles, knives), all items previously/currently identified as safety concern.    Remove drugs/medications (over-the-counter, prescriptions, illicit drugs), all items previously/currently identified as a safety concern.  The family member/significant other verbalizes understanding of the suicide prevention education information provided.  The family member/significant other agrees to remove the items of safety concern listed above.  Glennon MacLaws, Eulogia Dismore P, MSW, LCSW 07/02/2016, 3:59 PM

## 2016-07-02 NOTE — Plan of Care (Signed)
Problem: Education: Goal: Will be free of psychotic symptoms Outcome: Progressing No psychotic episodes noted   

## 2016-07-02 NOTE — Progress Notes (Signed)
Med and group compliant. Appropriate with staff and peers. No negative behaviors noted.  Encouragement and support offered. Pt receptive and remains safe on unit. Will continue with q 15 min safety checks.

## 2016-07-02 NOTE — Tx Team (Signed)
Interdisciplinary Treatment Plan Update (Adult)  Date:  07/02/2016 Time Reviewed:  4:02 PM  Progress in Treatment: Attending groups: Yes. Participating in groups:  Yes. Taking medication as prescribed:  Yes. has changed her mind about injectable after taking initial injection now wants pill form Tolerating medication:  Yes. Family/Significant othe contact made:  Yes, individual(s) contacted:  [name], sister Patient understands diagnosis:  Yes. Discussing patient identified problems/goals with staff:  Yes. Medical problems stabilized or resolved:  Yes. Denies suicidal/homicidal ideation: Yes. Issues/concerns per patient self-inventory:  No. Other:  New problem(s) identified: No, Describe:     Discharge Plan or Barriers: Discharge home and follow up with [facility] in [place]  Reason for Continuation of Hospitalization: Delusions  Depression Hallucinations Medication stabilization  Comments:On admission Patient is somewhat guarded. She states she does not know why she needed to come to the hospital. Patient endorses needing help with her "stress and thinking." She lost her jon in March and went to [place] in April where she stayed for 5 weeks due to stress and anxiety. She was reluctant to give more information.   Pt endorsed that voices blend together in her head and she feels dizzy when she is around people she doesn't know. 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 This led to her losing a job and people in her family died. Patient became tearful.   Estimated length of stay:1 day  New goal(s):  Review of initial/current patient goals per problem list:   1.  Goal(s): Patient will participate in aftercare plan * Met: YES * Target date: at  discharge * As evidenced by: Patient will participate within aftercare plan AEB aftercare provider and housing plan at discharge being identified.   2.  Goal (s): Patient will exhibit decreased depressive symptoms and suicidal ideations. * Met: NO *  Target date: at discharge * As evidenced by: Patient will utilize self-rating of depression at 3 or below and demonstrate decreased signs of depression or be deemed stable for discharge by MD.   3.  Goal (s): Patient will demonstrate decreased symptoms of psychosis. * Met: NO  *  Target date: at discharge As evidenced by: Patient will not endorse signs of psychosis or be deemed stable for discharge by MD.  Attendees: Patient:  Megan [name] 7/4/20174:02 PM  Family:   7/4/20174:02 PM  Physician:  Orson Slick 7/4/20174:02 PM  Nursing:   Floyde Parkins 7/4/20174:02 PM  Case Manager:   7/4/20174:02 PM  Counselor:  Dossie Arbour, LCSW 7/4/20174:02 PM  Other:  Everitt Amber,  [place] 7/4/20174:02 PM  Other:   7/4/20174:02 PM  Other:   7/4/20174:02 PM  Other:  7/4/20174:02 PM  Other:  7/4/20174:02 PM  Other:  7/4/20174:02 PM  Other:  7/4/20174:02 PM  Other:  7/4/20174:02 PM  Other:  7/4/20174:02 PM  Other:   7/4/20174:02 PM   Scribe for Treatment Team:   August Saucer, 07/02/2016, 4:02 PM, MSW, LCSW

## 2016-07-02 NOTE — [facility] Group Notes (Signed)
[facility] LCSW Group Therapy   07/02/2016 9:30 am  Type of Therapy: Group Therapy   Participation Level: Invited but did not attend.  Participation Quality: Invited but did not attend.    Theus Espin R. Shenea Giacobbe, LCSWA   

## 2016-07-02 NOTE — Progress Notes (Signed)
D: Pt denies SI/HI/AVH. Pt is pleasant and cooperative, affect is flat but brightens upon approach. Pt appears less anxious, thoughts are more organized no bizarre behavior noted. Pt is is interacting with peers and staff appropriately.  A: Pt was offered support and encouragement. Pt was given scheduled medications and  encouraged to attend groups. Q 15 minute checks were done for safety.  R:Pt attends groups and interacts well with peers and staff. Pt is taking medication. Pt has no complaints.Pt receptive to treatment and safety maintained on unit.
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
## 2016-07-03 NOTE — Progress Notes (Signed)
Recreation Therapy Notes  Date: 07.05.17 Time: 1:00 pm Location: Craft Room  Group Topic: Self-esteem  Goal Area(s) Addresses:  Patient will identify at least one positive trait about self. Patient will identity at least one healthy coping skill.  Behavioral Response: Did not attend  Intervention: All About Me  Activity: Patients were instructed to make an All About Me pamphlet with their life's motto, positive traits, healthy coping skills, and their support system.  Education: LRT educated patients on ways they can increase their self-esteem.  Education Outcome: Patient did not attend group.   Clinical Observations/Feedback: Patient did not attend group.  Jacquelynn CreeGreene,Undine Nealis M, LRT/CTRS 07/03/2016 2:45 PM

## 2016-07-03 NOTE — Progress Notes (Signed)
D: Pt denies SI/HI/AVH. Pt is pleasant and cooperative. Pt stated appears less anxious and she is interacting with peers and staff appropriately.  A: Pt was offered support and encouragement. Pt was given scheduled medications. Pt was encouraged to attend groups. Q 15 minute checks were done for safety.  R:Pt attends groups and interacts well with peers and staff. Pt is taking medication. Pt has no complaints.Pt receptive to treatment and safety maintained on unit.

## 2016-07-03 NOTE — [facility] Group Notes (Signed)
[facility] Group Notes:  (Nursing/MHT/Case Management/Adjunct)  Date:  07/03/2016  Time:  6:19 PM  Type of Therapy:  Psychoeducational Skills  Participation Level:  Active  Participation Quality:  Appropriate, Attentive and Sharing  Affect:  Appropriate  Cognitive:  Alert and Appropriate  Insight:  Appropriate  Engagement in Group:  Engaged  Modes of Intervention:  Discussion, Education and Support  Summary of Progress/Problems:  Lynelle SmokeCara Travis [facility] Hospital[facility]oni 07/03/2016, 6:19 PM

## 2016-07-03 NOTE — Progress Notes (Signed)
D: Pt denies SI/HI/AVH. Pt is pleasant and cooperative. Pt interacts with staff and peers appropriately.  Pt continues to be hesitant about new medication regimen. A: Pt was offered support and encouragement and provided education on medication regimen. Pt was given scheduled medications. Pt was encouraged to attend groups. Q 15 minute checks were done for safety.  R: Pt is taking scheduled medication.  Safety maintained on the unit.  Pt willing to communicate about medication.

## 2016-07-03 NOTE — [facility] Group Notes (Signed)
[facility] Group Notes:  (Nursing/MHT/Case Management/Adjunct)  Date:  07/03/2016  Time:  2:37 AM  Type of Therapy:  Group Therapy  Participation Level:  Active  Participation Quality:  Appropriate  Affect:  Appropriate  Cognitive:  Appropriate  Insight:  Good  Engagement in Group:  Engaged  Modes of Intervention:  n/a  Summary of Progress/Problems: Wrap up group was spent outside. Pt played basketball with fellow pts.    Fanny Skatesshley Imani Cristalle Rohm 07/03/2016, 2:37 AM

## 2016-07-03 NOTE — Plan of Care (Signed)
Problem: Education: Goal: Knowledge of the prescribed therapeutic regimen will improve Outcome: Not Progressing Patient is hesitant to accept new therapeutic medication regimen and information about the medication regimen.  Patient still insists that she would be better on her old regimen. Will continue to monitor.

## 2016-07-04 DIAGNOSIS — F4310 Post-traumatic stress disorder, unspecified: Secondary | ICD-10-CM | POA: Diagnosis present

## 2016-07-04 DIAGNOSIS — F17200 Nicotine dependence, unspecified, uncomplicated: Secondary | ICD-10-CM | POA: Diagnosis present

## 2016-07-04 MED ORDER — VENLAFAXINE HCL ER 150 MG PO CP24: 150.0000 mg | ORAL_CAPSULE | Freq: Every day | ORAL | Status: DC

## 2016-07-04 MED ORDER — PALIPERIDONE PALMITATE 234 MG/1.5ML IM SUSP: 234.0000 mg | INTRAMUSCULAR | Status: DC

## 2016-07-04 MED ORDER — PALIPERIDONE PALMITATE 156 MG/ML IM SUSP
156.0000 mg | Freq: Once | INTRAMUSCULAR | Status: AC
  Administered 2016-07-05: 156 mg via INTRAMUSCULAR
  Filled 2016-07-04: qty 1

## 2016-07-04 MED ORDER — PALIPERIDONE ER 9 MG PO TB24: 9.0000 mg | ORAL_TABLET | Freq: Every day | ORAL | Status: DC

## 2016-07-04 MED ORDER — PRAZOSIN HCL 2 MG PO CAPS: 2.0000 mg | ORAL_CAPSULE | Freq: Two times a day (BID) | ORAL | Status: DC

## 2016-07-04 MED ORDER — BISACODYL 10 MG RE SUPP
10.0000 mg | Freq: Once | RECTAL | Status: AC
  Administered 2016-07-04: 10 mg via RECTAL
  Filled 2016-07-04: qty 1

## 2016-07-04 MED ORDER — TRAZODONE HCL 100 MG PO TABS: 100.0000 mg | ORAL_TABLET | Freq: Every day | ORAL | Status: DC

## 2016-07-04 NOTE — Progress Notes (Signed)
D: Observed pt in dayroom interacting with peers. Patient alert and oriented x4. Patient denies SI/HI/AVH. Pt affect is blunted. Pt stated groups have been "helpful...learning how to get back focus." Pt rated depression 8/10 and anxiety 8/10 stating it's "the same" since she has been here. Pt indicated that she didn't want the monthly shots and would prefer the pills. Pt had concerns about safety of injections.  A: Offered active listening and support. Provided therapeutic communication. Administered scheduled medications. Reassured pt on safety of monthly IM and educated pt on it.  R: Pt pleasant and cooperative. Pt appeared to be reassured after discussion and education in injection. Pt medication compliant. Will continue Q15 min. checks. Safety maintained.
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
## 2016-07-04 NOTE — Progress Notes (Signed)
D: Patient stated slept fair last night .Stated appetite is good and energy level  Is normal. Stated concentration is good . Stated on Depression scale 8 , hopeless 8 and anxiety 8  .( low 0-10 high) Denies suicidal  homicidal ideations  .  No auditory hallucinations  No pain concerns . Appropriate ADL'S. Interacting with peers and staff.  Patient focus is Going Home .  Patient   Aware of getting her last  Invega injection  tomorrow A: Encourage patient participation with unit programming . Instruction  Given on  Medication , verbalize understanding. R: Voice no other concerns. Staff continue to monitor

## 2016-07-04 NOTE — [facility] Suicide Risk Assessment (Addendum)
[facility]   Principal Problem: Schizophrenia, undifferentiated (HCC) Discharge Diagnoses:  Patient Active Problem List   Diagnosis Date Noted  . Schizophrenia, undifferentiated (HCC) [F20.3] 06/29/2016    Total Time spent with patient: 30 minutes  Musculoskeletal: Strength & Muscle Tone: within normal limits Gait & Station: normal Patient leans: N/A  Psychiatric Specialty Exam: Review of Systems  Psychiatric/Behavioral: The patient is nervous/anxious.   All other systems reviewed and are negative.   Blood pressure 128/81, pulse 92, temperature 98.2 F (36.8 C), temperature source Oral, resp. rate 18, height 5\' 5"  (1.651 m), weight 80.74 kg (178 lb), last menstrual period 03/28/2016, SpO2 99 %.Body mass index is 29.62 kg/(m^2).  General Appearance: Casual  Eye Contact::  Good  Speech:  Clear and Coherent409  Volume:  Normal  Mood:  Euthymic  Affect:  Appropriate  Thought Process:  Goal Directed  Orientation:  Full (Time, Place, and Person)  Thought Content:  Delusions and Paranoid Ideation  Suicidal Thoughts:  No  Homicidal Thoughts:  No  Memory:  Immediate;   Fair Recent;   Fair Remote;   Fair  Judgement:  Poor  Insight:  Shallow  Psychomotor Activity:  Normal  Concentration:  Fair  Recall:  Fis[employer]Fair  Fund of Knowledge:Fair  Language: Fair  Akathisia:  No  Handed:  Right  AIMS (if indicated):     Assets:  Communication Skills Desire for Improvement Housing Physical Health Resilience Social Support  Sleep:  Number of Hours: 7  Cognition: WNL  ADL's:  Intact   Mental Status Per Nursing Assessment::   On Admission:     Demographic Factors:  Unemployed  Loss Factors: NA  Historical Factors: Impulsivity  Risk Reduction Factors:   Sense of responsibility to family, Living with another person, especially a relative, Positive social support and Positive therapeutic relationship  Continued Clinical Symptoms:  Schizophrenia:    Paranoid or undifferentiated type  Cognitive Features That Contribute To Risk:  None    Suicide Risk:  Minimal: No identifiable suicidal ideation.  Patients presenting with no risk factors but with morbid ruminations; may be classified as minimal risk based on the severity of the depressive symptoms  Follow-up Information    Follow up with [facility].   Why:  Please arrive to the walk-in clinic between the hours of 8am-2:30pm for an assessment for medication management and therapy.  Arrive as early as possible for prompt service.  Please call Unk PintoHarvey Bryant at [phone] for questions and assistance.   Contact information:   [facility] [address] [place] [address] Ph: [phone] Fax: [phone]      Plan Of Care/Follow-up recommendations:  Activity:  As tolerated. Diet:  Low sodium heart healthy. Other:  Keep follow-up appointments.  Kristine LineaJolanta Pucilowska, MD 07/04/2016, 11:33 AM

## 2016-07-04 NOTE — Progress Notes (Addendum)
[facility] MD Progress Note  07/04/2016 11:37 AM Megan [name]  MRN:  [id number]  Subjective:  Megan [name] met with her treatment team today. She scored and collected. She is not compared to talk about her paranoid delusions. She is able to participate in discharge planning. She agreed to take second injection of [place] tomorrow prior to discharge. Sleep and appetite are good. There are no somatic complaints. She tolerates medications well. Good group participation.  Principal Problem: Schizophrenia, undifferentiated ([place]) Diagnosis:   Patient Active Problem List   Diagnosis Date Noted  . Schizophrenia, undifferentiated ([place]) [F20.3] 06/29/2016   Total Time spent with patient: 20 minutes  Past Psychiatric History: Schizophrenia.  Past Medical History:  Past Medical History  Diagnosis Date  . Anxiety   . Depression    History reviewed. No pertinent past surgical history. Family History: History reviewed. No pertinent family history. Family Psychiatric  History: See H&P. Social History:  History  Alcohol Use No     History  Drug Use Not on file    Social History   Social History  . Marital Status: Married    Spouse Name: N/A  . Number of Children: N/A  . Years of Education: N/A   Social History Main Topics  . Smoking status: Never Smoker   . Smokeless tobacco: None  . Alcohol Use: No  . Drug Use: None  . Sexual Activity: Not Asked   Other Topics Concern  . None   Social History Narrative   Additional Social History:                         Sleep: Fair  Appetite:  Fair  Current Medications: Current Facility-Administered Medications  Medication Dose Route Frequency Provider Last Rate Last Dose  . acetaminophen (TYLENOL) tablet 650 mg  650 mg Oral Q6H PRN Hildred Priest, MD   650 mg at 07/01/16 6333  . alum & mag hydroxide-simeth (MAALOX/MYLANTA) 200-200-20 MG/5ML suspension 30 mL  30 mL Oral Q4H PRN Hildred Priest, MD       . magnesium hydroxide (MILK OF MAGNESIA) suspension 30 mL  30 mL Oral Daily PRN Hildred Priest, MD      . nicotine (NICODERM CQ - dosed in mg/24 hours) patch 21 mg  21 mg Transdermal Daily Hildred Priest, MD   21 mg at 06/30/16 0810  . [START ON 07/05/2016] paliperidone (INVEGA SUSTENNA) injection 156 mg  156 mg Intramuscular Once Brannon Decaire B Tora Prunty, MD      . paliperidone (INVEGA SUSTENNA) injection 234 mg  234 mg Intramuscular Q28 days Clovis Fredrickson, MD   234 mg at 07/01/16 1222  . paliperidone (INVEGA) 24 hr tablet 9 mg  9 mg Oral QHS Hildred Priest, MD   9 mg at 07/03/16 2158  . prazosin (MINIPRESS) capsule 2 mg  2 mg Oral BID Clovis Fredrickson, MD   2 mg at 07/04/16 0814  . traZODone (DESYREL) tablet 150 mg  150 mg Oral QHS Hildred Priest, MD   150 mg at 07/03/16 2158  . venlafaxine XR (EFFEXOR-XR) 24 hr capsule 150 mg  150 mg Oral Q breakfast Rodrickus Min B Davielle Lingelbach, MD   150 mg at 07/04/16 5456    Lab Results:  Results for orders placed or performed during the hospital encounter of 06/29/16 (from the past 48 hour(s))  T4, free     Status: None   Collection Time: 07/02/16 12:31 PM  Result Value Ref Range   Free T4  0.83 0.61 - 1.12 ng/dL    Comment: (NOTE) Biotin ingestion may interfere with free T4 tests. If the results are inconsistent with the TSH level, previous test results, or the clinical presentation, then consider biotin interference. If needed, order repeat testing after stopping biotin.     Blood Alcohol level:  Lab Results  Component Value Date   ETH <5 29/51/8841    Metabolic Disorder Labs: Lab Results  Component Value Date   HGBA1C 5.8 06/29/2016   Lab Results  Component Value Date   PROLACTIN 26.2* 06/29/2016   Lab Results  Component Value Date   CHOL 255* 06/29/2016   TRIG 369* 06/29/2016   HDL 66 06/29/2016   CHOLHDL 3.9 06/29/2016   VLDL 74* 06/29/2016   LDLCALC 115* 06/29/2016    Physical  Findings: AIMS: Facial and Oral Movements Muscles of Facial Expression: None, normal Lips and Perioral Area: None, normal Jaw: None, normal Tongue: None, normal,Extremity Movements Upper (arms, wrists, hands, fingers): None, normal Lower (legs, knees, ankles, toes): None, normal, Trunk Movements Neck, shoulders, hips: None, normal, Overall Severity Severity of abnormal movements (highest score from questions above): None, normal Incapacitation due to abnormal movements: None, normal, Dental Status Current problems with teeth and/or dentures?: No Does patient usually wear dentures?: No  CIWA:  CIWA-Ar Total: 0 COWS:  COWS Total Score: 0  Musculoskeletal: Strength & Muscle Tone: within normal limits Gait & Station: normal Patient leans: N/A  Psychiatric Specialty Exam: Physical Exam  Nursing note and vitals reviewed.   Review of Systems  Psychiatric/Behavioral: Positive for hallucinations.  All other systems reviewed and are negative.   Blood pressure 128/81, pulse 92, temperature 98.2 F (36.8 C), temperature source Oral, resp. rate 18, height 5' 5"  (1.651 m), weight 80.74 kg (178 lb), last menstrual period 03/28/2016, SpO2 99 %.Body mass index is 29.62 kg/(m^2).  General Appearance: Casual  Eye Contact:  Good  Speech:  Clear and Coherent  Volume:  Normal  Mood:  Euthymic  Affect:  Appropriate  Thought Process:  Goal Directed  Orientation:  Full (Time, Place, and Person)  Thought Content:  Delusions and Paranoid Ideation  Suicidal Thoughts:  No  Homicidal Thoughts:  No  Memory:  Immediate;   Fair Recent;   Fair Remote;   Fair  Judgement:  Impaired  Insight:  Shallow  Psychomotor Activity:  Normal  Concentration:  Concentration: Fair and Attention Span: Fair  Recall:  [employer] of Knowledge:  Fair  Language:  Fair  Akathisia:  No  Handed:  Right  AIMS (if indicated):     Assets:  Communication Skills Desire for Improvement Housing Physical  Health Resilience Social Support  ADL's:  Intact  Cognition:  WNL  Sleep:  Number of Hours: 7     Treatment Plan Summary: Daily contact with patient to assess and evaluate symptoms and progress in treatment and Medication management   Megan [name] is a 55 year old female with a history of depression, anxiety, suicide attempts psychosis and mood is ability admitted for worsening of depression and psychosis in the context of treatment noncompliance and severe social stressors.   1. Suicidal ideation. This has resolved. The patient is able to contract for safety. She is forward thinking and optimistic about the future   2. Mood and psychosis. We started Effexor for depression and oral Invega for psychosis. She received second dose of Invega sustenna injection on 7/7. Next injection on 8/4.   3. Insomnia. Trazodone was available.   4. Smoking. Nicotine  patch was available.  5. Metabolic syndromes screening. Lipid panel and hemoglobin A1c are normal. Prolactin is 26.   6. Low TSH. Free T4 is normal.  7. PTSD. We offered Minipress.   8. Disposition. She was discharged to home with her sister. She will follow up with [facility].  Orson Slick, MD 07/04/2016, 11:37 AM

## 2016-07-04 NOTE — [facility] Group Notes (Signed)
[facility] LCSW Group Therapy   07/04/2016  9:30AM  Type of Therapy: Group Therapy   Participation Level: Did Not Attend. Patient invited to participate but declined.    Rahmir Beever F. Melvie Paglia, MSW, LCSWA, LCAS     

## 2016-07-04 NOTE — Plan of Care (Signed)
Problem: Coping: Goal: Ability to verbalize feelings will improve Outcome: Progressing Attending unit programing , working on coping skills

## 2016-07-04 NOTE — [facility] Group Notes (Signed)
[facility]   07/04/2016 1 PM  ?  Participation Quality: Alert, Appropriate and Oriented   Mood/Affect: Depressed and Flat   Depression Rating: 8  Anxiety Rating: 8  Thoughts of Suicide: Pt denies SI/HI   Will you contract for safety? Yes   Current AVH: Pt denies   Plan for Discharge/Comments: Pt attended discharge planning group and actively participated in group. CSW provided pt with today's workbook. Pt shared the pt's  Goal at discharge is to resolve the pt's claim for disability.  Pt shared the pt intends to focus on issues which led to the pt disability claim involving her mental health  Transportation Means: Pt reports access to transportation   Supports: No supports mentioned at this time     Dorothe PeaJonathan F. Tami Blass, MSW, LCSWA, LCAS

## 2016-07-04 NOTE — Plan of Care (Signed)
Problem: Education: Goal: Verbalization of understanding the information provided will improve Outcome: Progressing Pt verbalized understanding of information discussed in regards to monthly IM.

## 2016-07-05 NOTE — Progress Notes (Signed)
Patient denies SI/HI, denies A/V hallucinations. Patient verbalizes understanding of discharge instructions, follow up care and prescriptions. Patient given all belongings from  locker. Patient escorted out by staff, transported by family. 

## 2016-07-05 NOTE — [facility] Group Notes (Signed)
[facility] Group Notes:  (Nursing/MHT/Case Management/Adjunct)  Date:  07/05/2016  Time:  2:16 AM  Type of Therapy:  Psychoeducational Skills  Participation Level:  Active  Participation Quality:  Appropriate, Attentive, Sharing and Supportive  Affect:  Excited  Cognitive:  Alert and Oriented  Insight:  Good  Engagement in Group:  Engaged and Supportive  Modes of Intervention:  Discussion and Exploration  Summary of Progress/Problems:  Megan [name] 07/05/2016, 2:16 AM

## 2016-07-05 NOTE — Tx Team (Signed)
Interdisciplinary Treatment Plan Update (Adult)  Date:  07/05/2016 Time Reviewed:  9:50 AM  Progress in Treatment: Attending groups: Yes. Participating in groups:  Yes. Taking medication as prescribed:  Yes. Tolerating medication:  Yes. Family/Significant othe contact made:  Yes, individual(s) contacted:  sister Patient understands diagnosis:  Yes. Discussing patient identified problems/goals with staff:  Yes. Medical problems stabilized or resolved:  Yes. Denies suicidal/homicidal ideation: Yes. Issues/concerns per patient self-inventory:  Yes. Other:  New problem(s) identified: No, Describe:  NA  Discharge Plan or Barriers: Pt plans to return home and follow up with outpatient.    Reason for Continuation of Hospitalization: Delusions  Depression Hallucinations Medication stabilization  Comments:Megan [name] met with her treatment team today. She scored and collected. She is not compared to talk about her paranoid delusions. She is able to participate in discharge planning. She agreed to take second injection of [place] tomorrow prior to discharge. Sleep and appetite are good. There are no somatic complaints. She tolerates medications well. Good group participation.  Estimated length of stay: Pt will likely d/c today.   New goal(s): NA  Review of initial/current patient goals per problem list:   1.  Goal(s): Patient will participate in aftercare plan * Met: Yes * Target date: at discharge * As evidenced by: Patient will participate within aftercare plan AEB aftercare provider and housing plan at discharge being identified.   2.  Goal (s): Patient will exhibit decreased depressive symptoms and suicidal ideations. * Met: Yes *  Target date: at discharge * As evidenced by: Patient will utilize self rating of depression at 3 or below and demonstrate decreased signs of depression or be deemed stable for discharge by MD.   3.  Goal(s): Patient will demonstrate  decreased signs and symptoms of anxiety. * Met: Yes * Target date: at discharge * As evidenced by: Patient will utilize self rating of anxiety at 3 or below and demonstrated decreased signs of anxiety, or be deemed stable for discharge by MD  4.  Goal (s): Patient will demonstrate decreased symptoms of psychosis. * Met: Yes *  Target date: at discharge * As evidenced by: Patient will not endorse signs of psychosis or be deemed stable for discharge by MD.   Attendees: Patient:  Megan [name] 7/7/20179:50 AM  Family:   7/7/20179:50 AM  Physician:   Dr. Bary Leriche  7/7/20179:50 AM  Nursing:   Junita Push, RN  7/7/20179:50 AM  Case Manager:   7/7/20179:50 AM  Counselor:   7/7/20179:50 AM  Other:  Wray Kearns, [place] 7/7/20179:50 AM  Other:   7/7/20179:50 AM  Other:   7/7/20179:50 AM  Other:  7/7/20179:50 AM  Other:  7/7/20179:50 AM  Other:  7/7/20179:50 AM  Other:  7/7/20179:50 AM  Other:  7/7/20179:50 AM  Other:  7/7/20179:50 AM  Other:   7/7/20179:50 AM   Scribe for Treatment Team:   Wray Kearns, MSW, [place]  07/05/2016, 9:50 AM

## 2016-07-05 NOTE — Progress Notes (Signed)
D: Observed pt in day room interacting with peers. Patient alert and oriented x4. Patient denies SI/HI/AVH. Pt affect is blunted. Pt stated her day was "good...ready to go home tomorrow." Pt going to groups and stated that she has learned "peace of mind...feeling better." Pt indicated that she feels that she has gotten more benefit out of her experience here than previous inpatient admission elsewhere. Pt rated depression 6/10 and anxiety 6/10.  A: Offered active listening and support. Provided therapeutic communication. Administered scheduled medications.  R: Pt pleasant and cooperative. Pt medication compliant. Will continue Q15 min. checks. Safety maintained.

## 2016-07-05 NOTE — [facility] Group Notes (Signed)
[facility] LCSW Group Therapy   07/05/2016 9:30 AM   Type of Therapy: Group Therapy   Participation Level: Active   Participation Quality: Attentive, Sharing and Supportive   Affect: Appropriate   Cognitive: Alert and Oriented   Insight: Developing/Improving and Engaged   Engagement in Therapy: Developing/Improving and Engaged   Modes of Intervention: Clarification, Confrontation, Discussion, Education, Exploration, Limit-setting, Orientation, Problem-solving, Rapport Building, Dance movement psychotherapisteality Testing, Socialization and Support   Summary of Progress/Problems: The topic for today was feelings about relapse. Pt discussed what relapse prevention is to them and identified triggers that they are on the path to relapse. Pt processed their feeling towards relapse and was able to relate to peers. Pt discussed coping skills that can be used for relapse prevention.  Pt shared that for the pt relapse means having to return to the hospital.  Pt shared that this also means not bring able to remain focused and have autonomy over the pt's own life.  Pt identified that the pt's sister is a trigger which for the pt can lead to relapse.  Pt was polite and cooperative with the CSW and other group members and focused and attentive to the topics discussed and the sharing of others.  Pt shared that the pt copes with the pt's problems by seeking a spiritual journey and seeking employment.   Dorothe PeaJonathan F. Riti Rollyson, MSW, LCSWA, LCAS

## 2016-07-05 NOTE — Plan of Care (Signed)
Problem: Health Behavior/Discharge Planning: Goal: Compliance with prescribed medication regimen will improve Outcome: Progressing Pt compliant with medication regimen   

## 2016-08-15 ENCOUNTER — Ambulatory Visit
Admission: RE | Admit: 2016-08-15 | Discharge: 2016-08-15 | Disposition: A | Discharge status: Home / Self Care | Payer: Disability Insurance | Source: Ambulatory Visit | Attending: Family Medicine | Admitting: Family Medicine

## 2016-08-15 ENCOUNTER — Other Ambulatory Visit: Payer: Self-pay | Admitting: Family Medicine

## 2016-08-15 DIAGNOSIS — E119 Type 2 diabetes mellitus without complications: Secondary | ICD-10-CM | POA: Diagnosis not present

## 2016-08-15 DIAGNOSIS — M545 Low back pain: Secondary | ICD-10-CM | POA: Insufficient documentation

## 2016-08-15 DIAGNOSIS — R52 Pain, unspecified: Secondary | ICD-10-CM

## 2016-08-15 DIAGNOSIS — M542 Cervicalgia: Secondary | ICD-10-CM | POA: Insufficient documentation

## 2016-08-15 DIAGNOSIS — R42 Dizziness and giddiness: Secondary | ICD-10-CM | POA: Diagnosis not present

## 2016-08-15 DIAGNOSIS — R51 Headache: Secondary | ICD-10-CM | POA: Insufficient documentation

## 2016-10-03 ENCOUNTER — Ambulatory Visit: Payer: Self-pay

## 2016-10-08 ENCOUNTER — Ambulatory Visit: Payer: Self-pay | Admitting: Pharmacy Technician

## 2016-10-08 NOTE — Progress Notes (Signed)
[name] cancelled eligibility appointment scheduled at [facility] on October 08, 2016.  Did not want to reschedule.

## 2017-01-02 ENCOUNTER — Ambulatory Visit: Payer: Disability Insurance | Admitting: Pharmacy Technician

## 2017-01-02 DIAGNOSIS — Z79899 Other long term (current) drug therapy: Secondary | ICD-10-CM

## 2017-01-02 NOTE — Progress Notes (Signed)
  Completed [facility] application and contract.  Patient agreed to all terms of the [facility] contract.   Provided patient with community resource material based on her particular needs.    Hinda GlatterInvega Sustenna Injection Prescription Application completed with patient.  Forwarded to Dr. Sherlyn LickMark Moffett for signature.  Upon receipt of signed application from provider, Gean BirchwoodInvega Sustenna Injection Prescription Application will be submitted to [employer].  Patient stated that her Medicaid will be in effective beginning February 27, 2017.  Explained to patient that [facility] would not be able to provide medication assistance after her Medicaid goes into effect.  Patient acknowledged and indicated that she understood.    Sherilyn DacostaBetty J. Desma Wilkowski Care Manager [facility]

## 2019-07-31 ENCOUNTER — Encounter: Payer: Self-pay | Admitting: Emergency Medicine

## 2019-07-31 ENCOUNTER — Emergency Department
Admission: EM | Admit: 2019-07-31 | Discharge: 2019-07-31 | Disposition: A | Discharge status: Home / Self Care | Payer: Medicare Other | Attending: Student in an Organized Health Care Education/Training Program | Admitting: Student in an Organized Health Care Education/Training Program

## 2019-07-31 ENCOUNTER — Other Ambulatory Visit: Payer: Self-pay

## 2019-07-31 DIAGNOSIS — J029 Acute pharyngitis, unspecified: Secondary | ICD-10-CM | POA: Diagnosis present

## 2019-07-31 DIAGNOSIS — Z79899 Other long term (current) drug therapy: Secondary | ICD-10-CM | POA: Insufficient documentation

## 2019-07-31 DIAGNOSIS — E119 Type 2 diabetes mellitus without complications: Secondary | ICD-10-CM | POA: Insufficient documentation

## 2019-07-31 DIAGNOSIS — J312 Chronic pharyngitis: Secondary | ICD-10-CM | POA: Insufficient documentation

## 2019-07-31 HISTORY — DX: Type 2 diabetes mellitus without complications: E11.9

## 2019-07-31 LAB — CBC WITH DIFFERENTIAL/PLATELET
Abs Immature Granulocytes: 0.02 10*3/uL (ref 0.00–0.07)
Basophils Absolute: 0 10*3/uL (ref 0.0–0.1)
Basophils Relative: 1 %
Eosinophils Absolute: 0.1 10*3/uL (ref 0.0–0.5)
Eosinophils Relative: 2 %
HCT: 39 % (ref 36.0–46.0)
Hemoglobin: 12.6 g/dL (ref 12.0–15.0)
Immature Granulocytes: 0 %
Lymphocytes Relative: 33 %
Lymphs Abs: 2 10*3/uL (ref 0.7–4.0)
MCH: 27.3 pg (ref 26.0–34.0)
MCHC: 32.3 g/dL (ref 30.0–36.0)
MCV: 84.4 fL (ref 80.0–100.0)
Monocytes Absolute: 0.5 10*3/uL (ref 0.1–1.0)
Monocytes Relative: 8 %
Neutro Abs: 3.6 10*3/uL (ref 1.7–7.7)
Neutrophils Relative %: 56 %
Platelets: 240 10*3/uL (ref 150–400)
RBC: 4.62 MIL/uL (ref 3.87–5.11)
RDW: 14.5 % (ref 11.5–15.5)
WBC: 6.3 10*3/uL (ref 4.0–10.5)
nRBC: 0 % (ref 0.0–0.2)

## 2019-07-31 LAB — BASIC METABOLIC PANEL
Anion gap: 10 (ref 5–15)
BUN: 12 mg/dL (ref 6–20)
CO2: 23 mmol/L (ref 22–32)
Calcium: 9 mg/dL (ref 8.9–10.3)
Chloride: 108 mmol/L (ref 98–111)
Creatinine, Ser: 0.85 mg/dL (ref 0.44–1.00)
GFR calc Af Amer: >60 mL/min (ref >60)
GFR calc non Af Amer: >60 mL/min (ref >60)
Glucose, Bld: 131 mg/dL — ABNORMAL HIGH (ref 70–99)
Potassium: 3.8 mmol/L (ref 3.5–5.1)
Sodium: 141 mmol/L (ref 135–145)

## 2019-07-31 LAB — URINALYSIS, COMPLETE (UACMP) WITH MICROSCOPIC
Bacteria, UA: NONE SEEN
Bilirubin Urine: NEGATIVE
Glucose, UA: NEGATIVE mg/dL
Hgb urine dipstick: NEGATIVE
Ketones, ur: NEGATIVE mg/dL
Leukocytes,Ua: NEGATIVE
Nitrite: NEGATIVE
Protein, ur: NEGATIVE mg/dL
Specific Gravity, Urine: 1.025 (ref 1.005–1.030)
pH: 5 (ref 5.0–8.0)
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
## 2019-07-31 NOTE — ED Provider Notes (Signed)
[facility] Emergency Department Provider Note    First MD Initiated Contact with Patient 07/31/19 2130     (approximate)  I have reviewed the triage vital signs and the nursing notes.   HISTORY  Chief Complaint Back Pain and Sore Throat    HPI Mirranda Monrroy is a 58 y.o. female below is a past medical history who presents to the ER for evaluation of 1 month of scratchy and sore throat as well as left low back pain that is been going on for over 1 month.  Denies any fevers.  No nausea or vomiting.  No trouble swallowing.  States that she is been taking her trazodone but stopped taking her paliperidone due to to fear that it was causing her sore throat.  She denies any hallucinations.  States that she has felt some anxiety but denies any HI or SI.   Past Medical History:  Diagnosis Date  . Anxiety   . Depression   . Diabetes mellitus without complication ([place])    No family history on file. History reviewed. No pertinent surgical history. Patient Active Problem List   Diagnosis Date Noted  . PTSD (post-traumatic stress disorder) 07/04/2016  . Tobacco use disorder 07/04/2016  . Schizophrenia, undifferentiated ([place]) 06/29/2016      Prior to Admission medications   Medication Sig Start Date End Date Taking? Authorizing Provider  paliperidone (INVEGA SUSTENNA) 234 MG/1.5ML SUSP injection Inject 234 mg into the muscle every 28 (twenty-eight) days. Next injection on July 31. 07/29/16   Pucilowska, Jolanta B, MD  paliperidone (INVEGA) 9 MG 24 hr tablet Take 1 tablet (9 mg total) by mouth at bedtime. 07/04/16   Pucilowska, Jolanta B, MD  prazosin (MINIPRESS) 2 MG capsule Take 1 capsule (2 mg total) by mouth 2 (two) times daily. 07/04/16   Pucilowska, Herma Ard B, MD  traZODone (DESYREL) 100 MG tablet Take 1 tablet (100 mg total) by mouth at bedtime. 07/04/16   Pucilowska, Herma Ard B, MD  venlafaxine XR (EFFEXOR-XR) 150 MG 24 hr capsule Take 1 capsule (150 mg total)  by mouth daily with breakfast. 07/04/16   Pucilowska, Wardell Honour, MD    Allergies Sulfa antibiotics    Social History Social History   Tobacco Use  . Smoking status: Never Smoker  Substance Use Topics  . Alcohol use: No  . Drug use: Not on file    Review of Systems Patient denies headaches, rhinorrhea, blurry vision, numbness, shortness of breath, chest pain, edema, cough, abdominal pain, nausea, vomiting, diarrhea, dysuria, fevers, rashes or hallucinations unless otherwise stated above in HPI. ____________________________________________   PHYSICAL EXAM:  VITAL SIGNS: Vitals:   07/31/19 1613 07/31/19 2142  BP: (!) 171/89 (!) 166/89  Pulse: 91 83  Resp: 16 17  Temp: 98.4 F (36.9 C)   SpO2: 98% 98%    Constitutional: Alert and oriented.  Eyes: Conjunctivae are normal.  Head: Atraumatic. Nose: No congestion/rhinnorhea. Mouth/Throat: Mucous membranes are moist.   Neck: No stridor. Painless ROM.  Cardiovascular: Normal rate, regular rhythm. Grossly normal heart sounds.  Good peripheral circulation. Respiratory: Normal respiratory effort.  No retractions. Lungs CTAB. Gastrointestinal: Soft and nontender. No distention. No abdominal bruits. No CVA tenderness. Genitourinary: deferred Musculoskeletal: No lower extremity tenderness nor edema.  No joint effusions. Neurologic: CN- intact.  No facial droop, Normal FNF.  Normal heel to shin.  Sensation intact bilaterally. Normal speech and language. No gross focal neurologic deficits are appreciated. No gait instability.  Skin:  Skin is warm,  dry and intact. No rash noted. Psychiatric: Mood and affect are anxious but appropriate, no SI or HI  ____________________________________________   LABS (all labs ordered are listed, but only abnormal results are displayed)  Results for orders placed or performed during the hospital encounter of 07/31/19 (from the past 24 hour(s))  CBC with Differential     Status: None   Collection  Time: 07/31/19  4:14 PM  Result Value Ref Range   WBC 6.3 4.0 - 10.5 K/uL   RBC 4.62 3.87 - 5.11 MIL/uL   Hemoglobin 12.6 12.0 - 15.0 g/dL   HCT 16.139.0 09.636.0 - 04.546.0 %   MCV 84.4 80.0 - 100.0 fL   MCH 27.3 26.0 - 34.0 pg   MCHC 32.3 30.0 - 36.0 g/dL   RDW 40.914.5 81.111.5 - 91.415.5 %   Platelets 240 150 - 400 K/uL   nRBC 0.0 0.0 - 0.2 %   Neutrophils Relative % 56 %   Neutro Abs 3.6 1.7 - 7.7 K/uL   Lymphocytes Relative 33 %   Lymphs Abs 2.0 0.7 - 4.0 K/uL   Monocytes Relative 8 %   Monocytes Absolute 0.5 0.1 - 1.0 K/uL   Eosinophils Relative 2 %   Eosinophils Absolute 0.1 0.0 - 0.5 K/uL   Basophils Relative 1 %   Basophils Absolute 0.0 0.0 - 0.1 K/uL   Immature Granulocytes 0 %   Abs Immature Granulocytes 0.02 0.00 - 0.07 K/uL  Basic metabolic panel     Status: Abnormal   Collection Time: 07/31/19  4:14 PM  Result Value Ref Range   Sodium 141 135 - 145 mmol/L   Potassium 3.8 3.5 - 5.1 mmol/L   Chloride 108 98 - 111 mmol/L   CO2 23 22 - 32 mmol/L   Glucose, Bld 131 (H) 70 - 99 mg/dL   BUN 12 6 - 20 mg/dL   Creatinine, Ser 7.820.85 0.44 - 1.00 mg/dL   Calcium 9.0 8.9 - 95.610.3 mg/dL   GFR calc non Af Amer >60 >60 mL/min   GFR calc Af Amer >60 >60 mL/min   Anion gap 10 5 - 15   ____________________________________________ ____________________________________________  RADIOLOGY   ____________________________________________   PROCEDURES  Procedure(s) performed:  Procedures    Critical Care performed: no ____________________________________________   INITIAL IMPRESSION / ASSESSMENT AND PLAN / ED COURSE  Pertinent labs & imaging results that were available during my care of the patient were reviewed by me and considered in my medical decision making (see chart for details).   DDX: Strep, COVID, allergic pharyngitis, abscess, musculoskeletal strain, UTI  Mindi JunkerShelia Barreras is a 58 y.o. who presents to the ED with symptoms as described above.  She is afebrile well-appearing in no  acute distress.  She is here for evaluation of symptoms that been ongoing for greater than 1 month.  Symptoms seem to be quite mild.  There is no evidence of PTA or RPA.  No uvular edema.  Does have some cobblestoning likely secondary to allergies.  I did recommend strep testing as well as coated.  Patient has declined both of these.  She does have capacity to do this.  Blood work is otherwise reassuring.  She denies any SI or HI.  Does not meet criteria for IVC.  Patient has follow-up with her psychiatrist.  States that she will be compliant with her medications and feels reassured that is unlikely to be causing her symptoms.  Have discussed with the patient and available family all diagnostics and treatments  performed thus far and all questions were answered to the best of my ability. The patient demonstrates understanding and agreement with plan.      The patient was evaluated in Emergency Department today for the symptoms described in the history of present illness. He/she was evaluated in the context of the global COVID-19 pandemic, which necessitated consideration that the patient might be at risk for infection with the SARS-CoV-2 virus that causes COVID-19. Institutional protocols and algorithms that pertain to the evaluation of patients at risk for COVID-19 are in a state of rapid change based on information released by regulatory bodies including the CDC and federal and state organizations. These policies and algorithms were followed during the patient's care in the ED.  As part of my medical decision making, I reviewed the following data within the electronic MEDICAL RECORD NUMBER Nursing notes reviewed and incorporated, Labs reviewed, notes from prior ED visits and [place] Controlled Substance Database   ____________________________________________   FINAL CLINICAL IMPRESSION(S) / ED DIAGNOSES  Final diagnoses:  Chronic sore throat      NEW MEDICATIONS STARTED DURING THIS VISIT:  New  Prescriptions   No medications on file     Note:  This document was prepared using Dragon voice recognition software and may include unintentional dictation errors.    Willy Eddyobinson, Maryna Yeagle, MD 07/31/19 2146

## 2019-08-02 LAB — URINE CULTURE: Culture: 30000 — AB

## 2020-06-19 ENCOUNTER — Encounter: Payer: Self-pay | Admitting: Nurse Practitioner

## 2020-06-19 ENCOUNTER — Other Ambulatory Visit: Payer: Self-pay

## 2020-06-19 ENCOUNTER — Ambulatory Visit (INDEPENDENT_AMBULATORY_CARE_PROVIDER_SITE_OTHER): Payer: Medicare Other | Admitting: Nurse Practitioner

## 2020-06-19 VITALS — BP 130/72 | HR 99 | Temp 98.9°F | Ht 62.1 in | Wt 169.8 lb

## 2020-06-19 DIAGNOSIS — E6609 Other obesity due to excess calories: Secondary | ICD-10-CM

## 2020-06-19 DIAGNOSIS — F4310 Post-traumatic stress disorder, unspecified: Secondary | ICD-10-CM

## 2020-06-19 DIAGNOSIS — R030 Elevated blood-pressure reading, without diagnosis of hypertension: Secondary | ICD-10-CM

## 2020-06-19 DIAGNOSIS — E669 Obesity, unspecified: Secondary | ICD-10-CM | POA: Insufficient documentation

## 2020-06-19 DIAGNOSIS — Z0000 Encounter for general adult medical examination without abnormal findings: Secondary | ICD-10-CM | POA: Insufficient documentation

## 2020-06-19 DIAGNOSIS — R7303 Prediabetes: Secondary | ICD-10-CM

## 2020-06-19 DIAGNOSIS — F203 Undifferentiated schizophrenia: Secondary | ICD-10-CM | POA: Diagnosis not present

## 2020-06-19 DIAGNOSIS — Z7689 Persons encountering health services in other specified circumstances: Secondary | ICD-10-CM | POA: Diagnosis not present

## 2020-06-19 DIAGNOSIS — H9203 Otalgia, bilateral: Secondary | ICD-10-CM | POA: Insufficient documentation

## 2020-06-19 DIAGNOSIS — Z6830 Body mass index (BMI) 30.0-30.9, adult: Secondary | ICD-10-CM

## 2020-06-19 DIAGNOSIS — R7309 Other abnormal glucose: Secondary | ICD-10-CM | POA: Insufficient documentation

## 2020-06-19 LAB — MICROALBUMIN, URINE WAIVED
Creatinine, Urine Waived: 300 mg/dL (ref 10–300)
Microalb, Ur Waived: 150 mg/L — ABNORMAL HIGH (ref 0–19)

## 2020-06-19 LAB — BAYER DCA HB A1C WAIVED: HB A1C (BAYER DCA - WAIVED): 5.7 % (ref <7.0)

## 2020-06-19 MED ORDER — LOSARTAN POTASSIUM 25 MG PO TABS: 25.0000 mg | ORAL_TABLET | Freq: Every day | ORAL | 4 refills | Status: DC

## 2020-06-19 NOTE — Progress Notes (Signed)
New Patient Office Visit  Subjective:  Patient ID: Megan [name], female    DOB: September 05, 1961  Age: 59 y.o. MRN: [id number]  CC:  Chief Complaint  Patient presents with   Establish Care   Diabetes    HPI Megan [name] presents for new patient visit to establish care.  Introduced to Publishing rights manager role and practice setting.  All questions answered.  Discussed provider/patient relationship and expectations.  DIABETES Last A1C on file is from 2017 -- 5.8%.  She is taking Metformin 500 MG daily, does not take every day.  Reports when she was in [facility] she was told she had prediabetes and they started her on 500 MG Metformin daily, but then [facility] told her to take twice a day, but reports they did not obtain labs. Hypoglycemic episodes: not checking Polydipsia/polyuria: no Visual disturbance: no Chest pain: no Paresthesias: no Glucose Monitoring: no  Accucheck frequency: Not Checking  Fasting glucose:  Post prandial:  Evening:  Before meals: Taking Insulin?: no  Long acting insulin:  Short acting insulin: Blood Pressure Monitoring: not checking Retinal Examination: Not up to Date Foot Exam: Up to Date Diabetic Education: Not Completed Pneumovax: Not up to Date Influenza: Up to Date Aspirin: no   EAR PAIN Feels symptoms started about one year.  Reports discomfort is to jaw area up into ears on and off.  Runs from side to side. Duration: one year Involved ear(s): bilateral Severity:  mild  Quality:  dull and aching Fever: no Otorrhea: no Upper respiratory infection symptoms: no Pruritus: no Hearing loss: no Water immersion no Using Q-tips: no Recurrent otitis media: no Status: stable Treatments attempted: none  SCHIZOPHRENIA & PTSD Attends [facility] for psychiatric services.  Taking Trazodone 200 MG nightly, Effexor 150 MG daily, Invega 6 MG QHS.   Has been with [facility] since at least 2015.  She reports remembering her mother struggling with mental health  issues.   Mood status: stable Satisfied with current treatment?: yes Symptom severity: moderate  Duration of current treatment : chronic Side effects: no Medication compliance: good compliance Psychotherapy/counseling: current Previous psychiatric medications: multiple different meds Depressed mood: yes Anxious mood: yes Anhedonia: no Significant weight loss or gain: no Insomnia: yes hard to fall asleep Fatigue: no Feelings of worthlessness or guilt: sometimes Impaired concentration/indecisiveness: yes Suicidal ideations: no Hopelessness: yes Crying spells: occasional Depression screen [facility] 2/9 06/19/2020  Decreased Interest 2  Down, Depressed, Hopeless 2  PHQ - 2 Score 4  Altered sleeping 3  Tired, decreased energy 3  Change in appetite 0  Feeling bad or failure about yourself  3  Trouble concentrating 1  Moving slowly or fidgety/restless 0  Suicidal thoughts 0  PHQ-9 Score 14  Difficult doing work/chores Not difficult at all   GAD 7 : Generalized Anxiety Score 06/19/2020  Nervous, Anxious, on Edge 3  Control/stop worrying 3  Worry too much - different things 3  Trouble relaxing 3  Restless 1  Easily annoyed or irritable 3  Afraid - awful might happen 3  Total GAD 7 Score 19  Anxiety Difficulty Somewhat difficult    Past Medical History:  Diagnosis Date   Anxiety    Depression    Diabetes mellitus without complication (HCC)     Past Surgical History:  Procedure Laterality Date   CESAREAN SECTION      Family History  Problem Relation Age of Onset   Alzheimer's disease Mother    Dementia Sister    Cancer Brother  Diabetes Maternal Grandmother    Dementia Sister    Lupus Sister    Cancer Sister     Social History   Socioeconomic History   Marital status: Married    Spouse name: Not on file   Number of children: Not on file   Years of education: Not on file   Highest education level: Not on file  Occupational History   Not on  file  Tobacco Use   Smoking status: Never Smoker   Smokeless tobacco: Former Counsellor Use: Never used  Substance and Sexual Activity   Alcohol use: Yes    Alcohol/week: 7.0 - 14.0 standard drinks    Types: 7 - 14 Shots of liquor per week   Drug use: Yes    Types: Marijuana   Sexual activity: Not on file  Other Topics Concern   Not on file  Social History Narrative   Not on file   Social Determinants of Health   Financial Resource Strain:    Difficulty of Paying Living Expenses:   Food Insecurity:    Worried About Charity fundraiser in the Last Year:    Arboriculturist in the Last Year:   Transportation Needs:    Film/video editor (Medical):    Lack of Transportation (Non-Medical):   Physical Activity:    Days of Exercise per Week:    Minutes of Exercise per Session:   Stress:    Feeling of Stress :   Social Connections:    Frequency of Communication with Friends and Family:    Frequency of Social Gatherings with Friends and Family:    Attends Religious Services:    Active Member of Clubs or Organizations:    Attends Music therapist:    Marital Status:   Intimate Partner Violence:    Fear of Current or Ex-Partner:    Emotionally Abused:    Physically Abused:    Sexually Abused:     ROS Review of Systems  Constitutional: Negative for activity change, appetite change, diaphoresis, fatigue and fever.  Respiratory: Negative for cough, chest tightness, shortness of breath and wheezing.   Cardiovascular: Negative for chest pain, palpitations and leg swelling.  Gastrointestinal: Negative.   Endocrine: Negative for cold intolerance, heat intolerance, polydipsia, polyphagia and polyuria.  Neurological: Negative.   Psychiatric/Behavioral: Negative.     Objective:   Today's Vitals: BP 130/72 (BP Location: Left Arm)    Pulse 99    Temp 98.9 F (37.2 C) (Oral)    Ht 5' 2.1" (1.577 m)    Wt 169 lb 12.8 oz (77  kg)    LMP 03/28/2016    SpO2 97%    BMI 30.96 kg/m   Physical Exam Vitals and nursing note reviewed.  Constitutional:      General: She is awake. She is not in acute distress.    Appearance: She is well-developed and well-groomed. She is obese. She is not ill-appearing.  HENT:     Head: Normocephalic.     Jaw: No tenderness, swelling or pain on movement.     Right Ear: Hearing, tympanic membrane, ear canal and external ear normal.     Left Ear: Hearing, tympanic membrane, ear canal and external ear normal.  Eyes:     General: Lids are normal.        Right eye: No discharge.        Left eye: No discharge.     Conjunctiva/sclera: Conjunctivae  normal.     Pupils: Pupils are equal, round, and reactive to light.  Neck:     Thyroid: No thyromegaly.     Vascular: No carotid bruit.  Cardiovascular:     Rate and Rhythm: Normal rate and regular rhythm.     Heart sounds: Normal heart sounds. No murmur heard.  No gallop.   Pulmonary:     Effort: Pulmonary effort is normal. No accessory muscle usage or respiratory distress.     Breath sounds: Normal breath sounds.  Abdominal:     General: Bowel sounds are normal.     Palpations: Abdomen is soft.  Musculoskeletal:     Cervical back: Normal range of motion and neck supple.     Right lower leg: No edema.     Left lower leg: No edema.  Lymphadenopathy:     Cervical: No cervical adenopathy.  Skin:    General: Skin is warm and dry.  Neurological:     Mental Status: She is alert and oriented to person, place, and time.  Psychiatric:        Attention and Perception: Attention normal.        Mood and Affect: Mood normal.        Speech: Speech normal.        Behavior: Behavior normal. Behavior is cooperative.        Thought Content: Thought content normal.     Assessment & Plan:   Problem List Items Addressed This Visit      Other   Schizophrenia, undifferentiated (HCC)    Chronic, ongoing. Continue collaboration with psychiatry  and current medication regimen.  She denies SI/HI.        PTSD (post-traumatic stress disorder)    Chronic, ongoing.  She denies SI/HI.  Continue collaboration with psychiatry and current medication regimen.        Relevant Medications   traZODone (DESYREL) 100 MG tablet   Prediabetes    Ongoing with A1C today 5.7% and urine ALB 150, A:C 30-300.  She does not consistently take Metformin and would like to try without it, which is appropriate.  Will try focus on diet and exercise over the next 3 months and then recheck A1C.  Recommend heavy focus on diabetic diet at home.  Return in 3 months.      Relevant Orders   Microalbumin, Urine Waived   Lipid Panel w/o Chol/HDL Ratio   TSH   Comprehensive metabolic panel   Bayer DCA Hb Q2W Waived   Preventative health care    Due for Hep C, HIV, pap, colonoscopy, mammogram screening. With T2DM due for PPSV23 and tetanus.   Discussed with patient, wishes to think about this and plan to further discuss next visit.      Elevated blood pressure reading    Initial BP elevated, repeat at goal.  Was noted to have urine ALB 150 and A:C 30-300, discussed with her adding on BP medication that offers kidney protection at low dose.  Educated her on Losartan and she is interested in starting.  Losartan 25 MG daily script sent.  Recommend focus on DASH diet and regular exercise at home.      Relevant Orders   Lipid Panel w/o Chol/HDL Ratio   TSH   Comprehensive metabolic panel   Ear pain, bilateral    Normal physical exam, suspect TMJ syndrome.  Recommend to reduce chewing gum use and snuff.  May take Tylenol as needed for discomfort or apply ice.  For worsening  or ongoing pain will refer to dental for further work-up and possible mouth guard.      Obesity    Recommended eating smaller high protein, low fat meals more frequently and exercising 30 mins a day 5 times a week with a goal of 10-15lb weight loss in the next 3 months. Patient voiced their  understanding and motivation to adhere to these recommendations.       Relevant Medications   metFORMIN (GLUCOPHAGE) 500 MG tablet    Other Visit Diagnoses    Encounter to establish care    -  Primary      Outpatient Encounter Medications as of 06/19/2020  Medication Sig   metFORMIN (GLUCOPHAGE) 500 MG tablet Take 500 mg by mouth 2 (two) times daily.   paliperidone (INVEGA) 6 MG 24 hr tablet Take 6 mg by mouth at bedtime.   traZODone (DESYREL) 100 MG tablet SMARTSIG:2 Tablet(s) By Mouth Every Night PRN   venlafaxine XR (EFFEXOR-XR) 150 MG 24 hr capsule Take 1 capsule (150 mg total) by mouth daily with breakfast.   losartan (COZAAR) 25 MG tablet Take 1 tablet (25 mg total) by mouth daily.   [DISCONTINUED] paliperidone (INVEGA SUSTENNA) 234 MG/1.5ML SUSP injection Inject 234 mg into the muscle every 28 (twenty-eight) days. Next injection on July 31.   [DISCONTINUED] paliperidone (INVEGA) 9 MG 24 hr tablet Take 1 tablet (9 mg total) by mouth at bedtime.   [DISCONTINUED] prazosin (MINIPRESS) 2 MG capsule Take 1 capsule (2 mg total) by mouth 2 (two) times daily.   [DISCONTINUED] traZODone (DESYREL) 100 MG tablet Take 1 tablet (100 mg total) by mouth at bedtime.   No facility-administered encounter medications on file as of 06/19/2020.    Follow-up: Return in about 3 months (around 09/19/2020) for Prediabetes, HTN.   Marjie Skiff, NP

## 2020-06-19 NOTE — Assessment & Plan Note (Signed)
Normal physical exam, suspect TMJ syndrome.  Recommend to reduce chewing gum use and snuff.  May take Tylenol as needed for discomfort or apply ice.  For worsening or ongoing pain will refer to dental for further work-up and possible mouth guard.

## 2020-06-19 NOTE — Assessment & Plan Note (Addendum)
Ongoing with A1C today 5.7% and urine ALB 150, A:C 30-300.  She does not consistently take Metformin and would like to try without it, which is appropriate.  Will try focus on diet and exercise over the next 3 months and then recheck A1C.  Recommend heavy focus on diabetic diet at home.  Return in 3 months.

## 2020-06-19 NOTE — Assessment & Plan Note (Signed)
Recommended eating smaller high protein, low fat meals more frequently and exercising 30 mins a day 5 times a week with a goal of 10-15lb weight loss in the next 3 months. Patient voiced their understanding and motivation to adhere to these recommendations.  

## 2020-06-19 NOTE — Patient Instructions (Signed)

## 2020-06-19 NOTE — Assessment & Plan Note (Signed)
Initial BP elevated, repeat at goal.  Was noted to have urine ALB 150 and A:C 30-300, discussed with her adding on BP medication that offers kidney protection at low dose.  Educated her on Losartan and she is interested in starting.  Losartan 25 MG daily script sent.  Recommend focus on DASH diet and regular exercise at home.

## 2020-06-19 NOTE — Assessment & Plan Note (Signed)
Chronic, ongoing. Continue collaboration with psychiatry and current medication regimen.  She denies SI/HI.   

## 2020-06-19 NOTE — Assessment & Plan Note (Signed)
Due for Hep C, HIV, pap, colonoscopy, mammogram screening. With T2DM due for PPSV23 and tetanus.   Discussed with patient, wishes to think about this and plan to further discuss next visit.

## 2020-06-19 NOTE — Assessment & Plan Note (Signed)
Chronic, ongoing.  She denies SI/HI.  Continue collaboration with psychiatry and current medication regimen.   

## 2020-06-20 LAB — COMPREHENSIVE METABOLIC PANEL
ALT: 27 IU/L (ref 0–32)
AST: 30 IU/L (ref 0–40)
Albumin/Globulin Ratio: 2 (ref 1.2–2.2)
Albumin: 4.5 g/dL (ref 3.8–4.9)
Alkaline Phosphatase: 60 IU/L (ref 48–121)
BUN/Creatinine Ratio: 12 (ref 9–23)
BUN: 10 mg/dL (ref 6–24)
Bilirubin Total: 0.3 mg/dL (ref 0.0–1.2)
CO2: 22 mmol/L (ref 20–29)
Calcium: 9.2 mg/dL (ref 8.7–10.2)
Chloride: 104 mmol/L (ref 96–106)
Creatinine, Ser: 0.82 mg/dL (ref 0.57–1.00)
GFR calc Af Amer: 91 mL/min/{1.73_m2} (ref >59)
GFR calc non Af Amer: 79 mL/min/{1.73_m2} (ref >59)
Globulin, Total: 2.3 g/dL (ref 1.5–4.5)
Glucose: 117 mg/dL — ABNORMAL HIGH (ref 65–99)
Potassium: 4.2 mmol/L (ref 3.5–5.2)
Sodium: 142 mmol/L (ref 134–144)
Total Protein: 6.8 g/dL (ref 6.0–8.5)

## 2020-06-20 LAB — LIPID PANEL W/O CHOL/HDL RATIO
Cholesterol, Total: 223 mg/dL — ABNORMAL HIGH (ref 100–199)
HDL: 83 mg/dL (ref >39)
LDL Chol Calc (NIH): 103 mg/dL — ABNORMAL HIGH (ref 0–99)
Triglycerides: 224 mg/dL — ABNORMAL HIGH (ref 0–149)
VLDL Cholesterol Cal: 37 mg/dL (ref 5–40)

## 2020-06-20 LAB — TSH: TSH: 0.496 u[IU]/mL (ref 0.450–4.500)

## 2020-06-20 NOTE — Progress Notes (Signed)
Please let Megan [name] know her labs have returned.  Kidney, liver, and thyroid are within normal ranges.  Cholesterol levels are elevated, but recommendation at this time is to focus on healthy diet changes and regular exercise.  If ongoing elevation we may consider medication in future.  I recommend rechecking this in 6 months with her fasting.  Any questions please let me know.  Have a great day!!

## 2020-07-14 ENCOUNTER — Ambulatory Visit (INDEPENDENT_AMBULATORY_CARE_PROVIDER_SITE_OTHER): Payer: Medicare Other

## 2020-07-14 VITALS — Ht 62.0 in | Wt 169.0 lb

## 2020-07-14 DIAGNOSIS — Z0000 Encounter for general adult medical examination without abnormal findings: Secondary | ICD-10-CM

## 2020-07-14 NOTE — Patient Instructions (Signed)
Megan [name] , Thank you for taking time to come for your Medicare Wellness Visit. I appreciate your ongoing commitment to your health goals. Please review the following plan we discussed and let me know if I can assist you in the future.   Screening recommendations/referrals: Colonoscopy: due  Mammogram: due Bone Density: n/a Recommended yearly ophthalmology/optometry visit for glaucoma screening and checkup Recommended yearly dental visit for hygiene and checkup  Vaccinations: Influenza vaccine: decline Pneumococcal vaccine: decline Tdap vaccine: decline Shingles vaccine: discussed  Covid-19: 05/16/2020, 04/25/2020  Advanced directives: Advance directive discussed with you today.   Conditions/risks identified: Been feeling a little dizzy since starting BP med.  Next appointment: Follow up in one year for your annual wellness visit.   Preventive Care 40-64 Years, Female Preventive care refers to lifestyle choices and visits with your health care provider that can promote health and wellness. What does preventive care include?  A yearly physical exam. This is also called an annual well check.  Dental exams once or twice a year.  Routine eye exams. Ask your health care provider how often you should have your eyes checked.  Personal lifestyle choices, including:  Daily care of your teeth and gums.  Regular physical activity.  Eating a healthy diet.  Avoiding tobacco and drug use.  Limiting alcohol use.  Practicing safe sex.  Taking low-dose aspirin daily starting at age 80.  Taking vitamin and mineral supplements as recommended by your health care provider. What happens during an annual well check? The services and screenings done by your health care provider during your annual well check will depend on your age, overall health, lifestyle risk factors, and family history of disease. Counseling  Your health care provider may ask you questions about your:  Alcohol  use.  Tobacco use.  Drug use.  Emotional well-being.  Home and relationship well-being.  Sexual activity.  Eating habits.  Work and work Statistician.  Method of birth control.  Menstrual cycle.  Pregnancy history. Screening  You may have the following tests or measurements:  Height, weight, and BMI.  Blood pressure.  Lipid and cholesterol levels. These may be checked every 5 years, or more frequently if you are over 19 years old.  Skin check.  Lung cancer screening. You may have this screening every year starting at age 30 if you have a 30-pack-year history of smoking and currently smoke or have quit within the past 15 years.  Fecal occult blood test (FOBT) of the stool. You may have this test every year starting at age 57.  Flexible sigmoidoscopy or colonoscopy. You may have a sigmoidoscopy every 5 years or a colonoscopy every 10 years starting at age 20.  Hepatitis C blood test.  Hepatitis B blood test.  Sexually transmitted disease (STD) testing.  Diabetes screening. This is done by checking your blood sugar (glucose) after you have not eaten for a while (fasting). You may have this done every 1-3 years.  Mammogram. This may be done every 1-2 years. Talk to your health care provider about when you should start having regular mammograms. This may depend on whether you have a family history of breast cancer.  BRCA-related cancer screening. This may be done if you have a family history of breast, ovarian, tubal, or peritoneal cancers.  Pelvic exam and Pap test. This may be done every 3 years starting at age 66. Starting at age 7, this may be done every 5 years if you have a Pap test in combination with  an HPV test.  Bone density scan. This is done to screen for osteoporosis. You may have this scan if you are at high risk for osteoporosis. Discuss your test results, treatment options, and if necessary, the need for more tests with your health care  provider. Vaccines  Your health care provider may recommend certain vaccines, such as:  Influenza vaccine. This is recommended every year.  Tetanus, diphtheria, and acellular pertussis (Tdap, Td) vaccine. You may need a Td booster every 10 years.  Zoster vaccine. You may need this after age 37.  Pneumococcal 13-valent conjugate (PCV13) vaccine. You may need this if you have certain conditions and were not previously vaccinated.  Pneumococcal polysaccharide (PPSV23) vaccine. You may need one or two doses if you smoke cigarettes or if you have certain conditions. Talk to your health care provider about which screenings and vaccines you need and how often you need them. This information is not intended to replace advice given to you by your health care provider. Make sure you discuss any questions you have with your health care provider. Document Released: 01/12/2016 Document Revised: 09/04/2016 Document Reviewed: 10/17/2015 Elsevier Interactive Patient Education  2017 [place] Prevention in the Home Falls can cause injuries. They can happen to people of all ages. There are many things you can do to make your home safe and to help prevent falls. What can I do on the outside of my home?  Regularly fix the edges of walkways and driveways and fix any cracks.  Remove anything that might make you trip as you walk through a door, such as a raised step or threshold.  Trim any bushes or trees on the path to your home.  Use bright outdoor lighting.  Clear any walking paths of anything that might make someone trip, such as rocks or tools.  Regularly check to see if handrails are loose or broken. Make sure that both sides of any steps have handrails.  Any raised decks and porches should have guardrails on the edges.  Have any leaves, snow, or ice cleared regularly.  Use sand or salt on walking paths during winter.  Clean up any spills in your garage right away. This includes  oil or grease spills. What can I do in the bathroom?  Use night lights.  Install grab bars by the toilet and in the tub and shower. Do not use towel bars as grab bars.  Use non-skid mats or decals in the tub or shower.  If you need to sit down in the shower, use a plastic, non-slip stool.  Keep the floor dry. Clean up any water that spills on the floor as soon as it happens.  Remove soap buildup in the tub or shower regularly.  Attach bath mats securely with double-sided non-slip rug tape.  Do not have throw rugs and other things on the floor that can make you trip. What can I do in the bedroom?  Use night lights.  Make sure that you have a light by your bed that is easy to reach.  Do not use any sheets or blankets that are too big for your bed. They should not hang down onto the floor.  Have a firm chair that has side arms. You can use this for support while you get dressed.  Do not have throw rugs and other things on the floor that can make you trip. What can I do in the kitchen?  Clean up any spills right away.  Avoid walking on wet floors.  Keep items that you use a lot in easy-to-reach places.  If you need to reach something above you, use a strong step stool that has a grab bar.  Keep electrical cords out of the way.  Do not use floor polish or wax that makes floors slippery. If you must use wax, use non-skid floor wax.  Do not have throw rugs and other things on the floor that can make you trip. What can I do with my stairs?  Do not leave any items on the stairs.  Make sure that there are handrails on both sides of the stairs and use them. Fix handrails that are broken or loose. Make sure that handrails are as long as the stairways.  Check any carpeting to make sure that it is firmly attached to the stairs. Fix any carpet that is loose or worn.  Avoid having throw rugs at the top or bottom of the stairs. If you do have throw rugs, attach them to the floor  with carpet tape.  Make sure that you have a light switch at the top of the stairs and the bottom of the stairs. If you do not have them, ask someone to add them for you. What else can I do to help prevent falls?  Wear shoes that:  Do not have high heels.  Have rubber bottoms.  Are comfortable and fit you well.  Are closed at the toe. Do not wear sandals.  If you use a stepladder:  Make sure that it is fully opened. Do not climb a closed stepladder.  Make sure that both sides of the stepladder are locked into place.  Ask someone to hold it for you, if possible.  Clearly mark and make sure that you can see:  Any grab bars or handrails.  First and last steps.  Where the edge of each step is.  Use tools that help you move around (mobility aids) if they are needed. These include:  Canes.  Walkers.  Scooters.  Crutches.  Turn on the lights when you go into a dark area. Replace any light bulbs as soon as they burn out.  Set up your furniture so you have a clear path. Avoid moving your furniture around.  If any of your floors are uneven, fix them.  If there are any pets around you, be aware of where they are.  Review your medicines with your doctor. Some medicines can make you feel dizzy. This can increase your chance of falling. Ask your doctor what other things that you can do to help prevent falls. This information is not intended to replace advice given to you by your health care provider. Make sure you discuss any questions you have with your health care provider. Document Released: 10/12/2009 Document Revised: 05/23/2016 Document Reviewed: 01/20/2015 Elsevier Interactive Patient Education  2017 [employer].

## 2020-07-14 NOTE — Progress Notes (Signed)
I connected with Rolene Arbour today by telephone and verified that I am speaking with the correct person using two identifiers. Location patient: home Location provider: work Persons participating in the virtual visit: Druscilla Petsch, Elisha Ponder LPN.   I discussed the limitations, risks, security and privacy concerns of performing an evaluation and management service by telephone and the availability of in person appointments. I also discussed with the patient that there may be a patient responsible charge related to this service. The patient expressed understanding and verbally consented to this telephonic visit.    Interactive audio and video telecommunications were attempted between this provider and patient, however failed, due to patient having technical difficulties OR patient did not have access to video capability.  We continued and completed visit with audio only.  Vital signs may be patient reported or missing.     Subjective:   Megan [name] is a 59 y.o. female who presents for an Initial Medicare Annual Wellness Visit.  Review of Systems     Cardiac Risk Factors include: none     Objective:    Today's Vitals   07/14/20 1427  Weight: 169 lb (76.7 kg)  Height: 5\' 2"  (1.575 m)   Body mass index is 30.91 kg/m.  Advanced Directives 07/14/2020 07/31/2019 06/28/2016  Does Patient Have a Medical Advance Directive? No No No  Would patient like information on creating a medical advance directive? - No - Patient declined Yes - Educational materials given  Some encounter information is confidential and restricted. Go to Review Flowsheets activity to see all data.    Current Medications (verified) Outpatient Encounter Medications as of 07/14/2020  Medication Sig  . losartan (COZAAR) 25 MG tablet Take 1 tablet (25 mg total) by mouth daily.  . paliperidone (INVEGA) 6 MG 24 hr tablet Take 6 mg by mouth at bedtime.  . traZODone (DESYREL) 100 MG tablet SMARTSIG:2  Tablet(s) By Mouth Every Night PRN  . venlafaxine XR (EFFEXOR-XR) 150 MG 24 hr capsule Take 1 capsule (150 mg total) by mouth daily with breakfast.  . metFORMIN (GLUCOPHAGE) 500 MG tablet Take 500 mg by mouth 2 (two) times daily. (Patient not taking: Reported on 07/14/2020)   No facility-administered encounter medications on file as of 07/14/2020.    Allergies (verified) Sulfa antibiotics   History: Past Medical History:  Diagnosis Date  . Anxiety   . Depression   . Diabetes mellitus without complication [facility])    Past Surgical History:  Procedure Laterality Date  . CESAREAN SECTION     Family History  Problem Relation Age of Onset  . Alzheimer's disease Mother   . Dementia Sister   . Cancer Brother   . Diabetes Maternal Grandmother   . Dementia Sister   . Lupus Sister   . Cancer Sister    Social History   Socioeconomic History  . Marital status: Married    Spouse name: Not on file  . Number of children: Not on file  . Years of education: Not on file  . Highest education level: Not on file  Occupational History  . Not on file  Tobacco Use  . Smoking status: Never Smoker  . Smokeless tobacco: Former [facility]  . Vaping Use: Never used  Substance and Sexual Activity  . Alcohol use: Yes    Alcohol/week: 8.0 standard drinks    Types: 5 Shots of liquor, 3 Cans of beer per week  . Drug use: Yes    Types: Marijuana  Comment: has quit  . Sexual activity: Not Currently  Other Topics Concern  . Not on file  Social History Narrative  . Not on file   Social Determinants of Health   Financial Resource Strain: Low Risk   . Difficulty of Paying Living Expenses: Not hard at all  Food Insecurity: No Food Insecurity  . Worried About Programme researcher, broadcasting/film/video in the Last Year: Never true  . Ran Out of Food in the Last Year: Never true  Transportation Needs: No Transportation Needs  . Lack of Transportation (Medical): No  . Lack of Transportation (Non-Medical): No   Physical Activity: Sufficiently Active  . Days of Exercise per Week: 7 days  . Minutes of Exercise per Session: 30 min  Stress: No Stress Concern Present  . Feeling of Stress : Not at all  Social Connections:   . Frequency of Communication with Friends and Family:   . Frequency of Social Gatherings with Friends and Family:   . Attends Religious Services:   . Active Member of Clubs or Organizations:   . Attends Banker Meetings:   Marland Kitchen Marital Status:     Tobacco Counseling Counseling given: Not Answered   Clinical Intake:  Pre-visit preparation completed: Yes  Pain : No/denies pain     Nutritional Status: BMI > 30  Obese Nutritional Risks: None Diabetes: No  How often do you need to have someone help you when you read instructions, pamphlets, or other written materials from your doctor or pharmacy?: 1 - Never What is the last grade level you completed in school?: 11th grade  Diabetic? no  Interpreter Needed?: No  Information entered by :: NAllen LPN   Activities of Daily Living In your present state of health, do you have any difficulty performing the following activities: 07/14/2020 06/19/2020  Hearing? N N  Vision? N N  Comment sees dots sometimes -  Difficulty concentrating or making decisions? N N  Walking or climbing stairs? N N  Dressing or bathing? N N  Doing errands, shopping? N N  Preparing Food and eating ? N -  Using the Toilet? N -  In the past six months, have you accidently leaked urine? N -  Do you have problems with loss of bowel control? N -  Managing your Medications? N -  Managing your Finances? N -  Housekeeping or managing your Housekeeping? N -  Some recent data might be hidden    Patient Care Team: Marjie Skiff, NP as PCP - General (Nurse Practitioner)  Indicate any recent Medical Services you may have received from other than [facility] providers in the past year (date may be approximate).     Assessment:   This is a  routine wellness examination for Megan [name].  Hearing/Vision screen  Hearing Screening   125Hz  250Hz  500Hz  1000Hz  2000Hz  3000Hz  4000Hz  6000Hz  8000Hz   Right ear:           Left ear:           Vision Screening Comments: No regular eye exams, Plans to see Dr.  Dietary issues and exercise activities discussed: Current Exercise Habits: Home exercise routine, Type of exercise: walking, Time (Minutes): 30, Frequency (Times/Week): 7, Weekly Exercise (Minutes/Week): 210  Goals    . Patient Stated     07/14/2020, no goals      Depression Screen PHQ 2/9 Scores 07/14/2020 06/19/2020  PHQ - 2 Score 3 4  PHQ- 9 Score 10 14    Fall Risk  Fall Risk  07/14/2020 06/19/2020  Falls in the past year? 0 0  Number falls in past yr: - 0  Injury with Fall? - 0  Risk for fall due to : Medication side effect -  Follow up Falls evaluation completed;Education provided;Falls prevention discussed Falls evaluation completed    Any stairs in or around the home? No  If so, are there any without handrails? n/a Home free of loose throw rugs in walkways, pet beds, electrical cords, etc? Yes  Adequate lighting in your home to reduce risk of falls? Yes   ASSISTIVE DEVICES UTILIZED TO PREVENT FALLS:  Life alert? No  Use of a cane, walker or w/c? No  Grab bars in the bathroom? No  Shower chair or bench in shower? Yes  Elevated toilet seat or a handicapped toilet? No   TIMED UP AND GO:  Was the test performed? No .    Cognitive Function:     6CIT Screen 07/14/2020  What Year? 0 points  What month? 0 points  What time? 0 points  Count back from 20 0 points  Months in reverse 0 points  Repeat phrase 0 points  Total Score 0    Immunizations Immunization History  Administered Date(s) Administered  . PFIZER SARS-COV-2 Vaccination 04/25/2020, 05/16/2020    TDAP status: Due, Education has been provided regarding the importance of this vaccine. Advised may receive this vaccine at local pharmacy or  Health Dept. Aware to provide a copy of the vaccination record if obtained from local pharmacy or Health Dept. Verbalized acceptance and understanding. Flu Vaccine status: Declined, Education has been provided regarding the importance of this vaccine but patient still declined. Advised may receive this vaccine at local pharmacy or Health Dept. Aware to provide a copy of the vaccination record if obtained from local pharmacy or Health Dept. Verbalized acceptance and understanding. Pneumococcal vaccine status: Declined,  Education has been provided regarding the importance of this vaccine but patient still declined. Advised may receive this vaccine at local pharmacy or Health Dept. Aware to provide a copy of the vaccination record if obtained from local pharmacy or Health Dept. Verbalized acceptance and understanding.  Covid-19 vaccine status: Completed vaccines  Qualifies for Shingles Vaccine? Yes   Zostavax completed No   Shingrix Completed?: No.    Education has been provided regarding the importance of this vaccine. Patient has been advised to call insurance company to determine out of pocket expense if they have not yet received this vaccine. Advised may also receive vaccine at local pharmacy or Health Dept. Verbalized acceptance and understanding.  Screening Tests Health Maintenance  Topic Date Due  . Hepatitis C Screening  Never done  . PNEUMOCOCCAL POLYSACCHARIDE VACCINE AGE 44-64 HIGH RISK  Never done  . OPHTHALMOLOGY EXAM  Never done  . HIV Screening  Never done  . TETANUS/TDAP  Never done  . PAP SMEAR-Modifier  Never done  . MAMMOGRAM  Never done  . COLONOSCOPY  Never done  . INFLUENZA VACCINE  07/30/2020  . HEMOGLOBIN A1C  12/19/2020  . FOOT EXAM  06/19/2021  . COVID-19 Vaccine  Completed    Health Maintenance  Health Maintenance Due  Topic Date Due  . Hepatitis C Screening  Never done  . PNEUMOCOCCAL POLYSACCHARIDE VACCINE AGE 44-64 HIGH RISK  Never done  . OPHTHALMOLOGY EXAM   Never done  . HIV Screening  Never done  . TETANUS/TDAP  Never done  . PAP SMEAR-Modifier  Never done  . MAMMOGRAM  Never  done  . COLONOSCOPY  Never done    Colorectal cancer screening: Due Mammogram status: Due Bone Density status: n/a  Lung Cancer Screening: (Low Dose CT Chest recommended if Age 66-80 years, 30 pack-year currently smoking OR have quit w/in 15years.) does not qualify.   Lung Cancer Screening Referral: no  Additional Screening:  Hepatitis C Screening: does qualify;   Vision Screening: Recommended annual ophthalmology exams for early detection of glaucoma and other disorders of the eye. Is the patient up to date with their annual eye exam?  No  Who is the provider or what is the name of the office in which the patient attends annual eye exams? Planning to see Dr. Clydene PughWoodard If pt is not established with a provider, would they like to be referred to a provider to establish care? No .   Dental Screening: Recommended annual dental exams for proper oral hygiene  Community Resource Referral / Chronic Care Management: CRR required this visit?  No   CCM required this visit?  No      Plan:     I have personally reviewed and noted the following in the patient's chart:   . Medical and social history . Use of alcohol, tobacco or illicit drugs  . Current medications and supplements . Functional ability and status . Nutritional status . Physical activity . Advanced directives . List of other physicians . Hospitalizations, surgeries, and ER visits in previous 12 months . Vitals . Screenings to include cognitive, depression, and falls . Referrals and appointments  In addition, I have reviewed and discussed with patient certain preventive protocols, quality metrics, and best practice recommendations. A written personalized care plan for preventive services as well as general preventive health recommendations were provided to patient.     Barb Merinoickeah E Tomorrow Dehaas,  LPN   [id number]/16/2021   Nurse Notes: Patient states that she thinks her BP med may be to strong she has been feeling dizzy. Encouraged to call the office to see about scheduling an appointment to discuss this matter. Patient is due for mammogram and bone density. Patient has not had vaccines except the covid vaccines. States she has a good immune system. Has not had an eye exam in a while, but states that she is planning to go to Dr. Clydene PughWoodard to get one.

## 2020-09-19 ENCOUNTER — Ambulatory Visit: Payer: Medicare Other | Admitting: Nurse Practitioner

## 2021-07-16 ENCOUNTER — Ambulatory Visit (INDEPENDENT_AMBULATORY_CARE_PROVIDER_SITE_OTHER): Payer: Medicare (Managed Care) | Admitting: Nurse Practitioner

## 2021-07-16 ENCOUNTER — Ambulatory Visit (INDEPENDENT_AMBULATORY_CARE_PROVIDER_SITE_OTHER): Payer: Medicare (Managed Care)

## 2021-07-16 ENCOUNTER — Other Ambulatory Visit: Payer: Self-pay

## 2021-07-16 ENCOUNTER — Encounter: Payer: Self-pay | Admitting: Nurse Practitioner

## 2021-07-16 VITALS — BP 126/78 | HR 98 | Temp 99.2°F | Wt 170.6 lb

## 2021-07-16 VITALS — Ht 62.0 in | Wt 180.0 lb

## 2021-07-16 DIAGNOSIS — Z1159 Encounter for screening for other viral diseases: Secondary | ICD-10-CM

## 2021-07-16 DIAGNOSIS — M1711 Unilateral primary osteoarthritis, right knee: Secondary | ICD-10-CM

## 2021-07-16 DIAGNOSIS — D649 Anemia, unspecified: Secondary | ICD-10-CM

## 2021-07-16 DIAGNOSIS — Z1211 Encounter for screening for malignant neoplasm of colon: Secondary | ICD-10-CM

## 2021-07-16 DIAGNOSIS — Z23 Encounter for immunization: Secondary | ICD-10-CM

## 2021-07-16 DIAGNOSIS — Z114 Encounter for screening for human immunodeficiency virus [HIV]: Secondary | ICD-10-CM

## 2021-07-16 DIAGNOSIS — E6609 Other obesity due to excess calories: Secondary | ICD-10-CM

## 2021-07-16 DIAGNOSIS — Z6831 Body mass index (BMI) 31.0-31.9, adult: Secondary | ICD-10-CM

## 2021-07-16 DIAGNOSIS — Z0000 Encounter for general adult medical examination without abnormal findings: Secondary | ICD-10-CM

## 2021-07-16 DIAGNOSIS — M19019 Primary osteoarthritis, unspecified shoulder: Secondary | ICD-10-CM | POA: Diagnosis not present

## 2021-07-16 DIAGNOSIS — E559 Vitamin D deficiency, unspecified: Secondary | ICD-10-CM

## 2021-07-16 DIAGNOSIS — R7309 Other abnormal glucose: Secondary | ICD-10-CM

## 2021-07-16 DIAGNOSIS — F4310 Post-traumatic stress disorder, unspecified: Secondary | ICD-10-CM

## 2021-07-16 DIAGNOSIS — E1169 Type 2 diabetes mellitus with other specified complication: Secondary | ICD-10-CM | POA: Insufficient documentation

## 2021-07-16 DIAGNOSIS — Z872 Personal history of diseases of the skin and subcutaneous tissue: Secondary | ICD-10-CM

## 2021-07-16 DIAGNOSIS — M1990 Unspecified osteoarthritis, unspecified site: Secondary | ICD-10-CM

## 2021-07-16 DIAGNOSIS — R030 Elevated blood-pressure reading, without diagnosis of hypertension: Secondary | ICD-10-CM | POA: Diagnosis not present

## 2021-07-16 DIAGNOSIS — Z1231 Encounter for screening mammogram for malignant neoplasm of breast: Secondary | ICD-10-CM

## 2021-07-16 DIAGNOSIS — F203 Undifferentiated schizophrenia: Secondary | ICD-10-CM

## 2021-07-16 DIAGNOSIS — E7800 Pure hypercholesterolemia, unspecified: Secondary | ICD-10-CM

## 2021-07-16 DIAGNOSIS — E785 Hyperlipidemia, unspecified: Secondary | ICD-10-CM | POA: Insufficient documentation

## 2021-07-16 DIAGNOSIS — E039 Hypothyroidism, unspecified: Secondary | ICD-10-CM

## 2021-07-16 MED ORDER — SHINGRIX 50 MCG/0.5ML IM SUSR: 0.5000 mL | Freq: Once | INTRAMUSCULAR | 0 refills | Status: AC

## 2021-07-16 MED ORDER — MELOXICAM 7.5 MG PO TABS: 7.5000 mg | ORAL_TABLET | Freq: Every day | ORAL | 5 refills | Status: DC

## 2021-07-16 NOTE — Assessment & Plan Note (Signed)
History of elevation, she reports she is fasting today.  Sister with history of CVA, discussed her current ASCVD 10% with her and recommend medication, but patient refuses at this time.  Continue to recommend statin use.

## 2021-07-16 NOTE — Assessment & Plan Note (Signed)
Due for Hep C, HIV, pap, colonoscopy, mammogram screening -- all ordered today With elevated A1c due for PPSV23 and tetanus -- refuses these. Pap -- refuses this. Discussed with patient, wishes to think about this and plan to further discuss next visit.

## 2021-07-16 NOTE — Assessment & Plan Note (Signed)
At goal in office today, she has stopped Losartan due to dizziness.  Recommend continue diet focus, with DASH diet.  Check BP at home 3 days a week and report if consistent above 130/80.  If need to start medication consider Lisinopril 2.5 MG or 5 MG, low dose to start.  Check CBC, CMP, TSH today.  Return in 6 months.

## 2021-07-16 NOTE — Assessment & Plan Note (Signed)
Chronic, ongoing, suspect OA to knees and shoulder.  Right knee is the worst, she does not wish to pursue imaging at this time.  Will check for underlying inflammatory disease -- CRP, ESR, ANA today.  Meloxicam 7.5 MG script sent -- to use as needed only.  Recommend use of heat as needed + OTC creams such as Voltaren gel or Icy/Hot.  Return to office in 6 months, sooner if worsening.

## 2021-07-16 NOTE — Patient Instructions (Signed)
Megan [name] , Thank you for taking time to come for your Medicare Wellness Visit. I appreciate your ongoing commitment to your health goals. Please review the following plan we discussed and let me know if I can assist you in the future.   Screening recommendations/referrals: Colonoscopy: due Mammogram: ordered today Bone Density: n/a Recommended yearly ophthalmology/optometry visit for glaucoma screening and checkup Recommended yearly dental visit for hygiene and checkup  Vaccinations: Influenza vaccine: decline Pneumococcal vaccine: decline Tdap vaccine: due Shingles vaccine: discussed  Covid-19:  05/16/2020, 04/25/2020  Advanced directives: Advance directive discussed with you today.   Conditions/risks identified: none  Next appointment: Follow up in one year for your annual wellness visit.   Preventive Care 40-64 Years, Female Preventive care refers to lifestyle choices and visits with your health care provider that can promote health and wellness. What does preventive care include? A yearly physical exam. This is also called an annual well check. Dental exams once or twice a year. Routine eye exams. Ask your health care provider how often you should have your eyes checked. Personal lifestyle choices, including: Daily care of your teeth and gums. Regular physical activity. Eating a healthy diet. Avoiding tobacco and drug use. Limiting alcohol use. Practicing safe sex. Taking low-dose aspirin daily starting at age 37. Taking vitamin and mineral supplements as recommended by your health care provider. What happens during an annual well check? The services and screenings done by your health care provider during your annual well check will depend on your age, overall health, lifestyle risk factors, and family history of disease. Counseling  Your health care provider may ask you questions about your: Alcohol use. Tobacco use. Drug use. Emotional well-being. Home and  relationship well-being. Sexual activity. Eating habits. Work and work Statistician. Method of birth control. Menstrual cycle. Pregnancy history. Screening  You may have the following tests or measurements: Height, weight, and BMI. Blood pressure. Lipid and cholesterol levels. These may be checked every 5 years, or more frequently if you are over 54 years old. Skin check. Lung cancer screening. You may have this screening every year starting at age 57 if you have a 30-pack-year history of smoking and currently smoke or have quit within the past 15 years. Fecal occult blood test (FOBT) of the stool. You may have this test every year starting at age 68. Flexible sigmoidoscopy or colonoscopy. You may have a sigmoidoscopy every 5 years or a colonoscopy every 10 years starting at age 59. Hepatitis C blood test. Hepatitis B blood test. Sexually transmitted disease (STD) testing. Diabetes screening. This is done by checking your blood sugar (glucose) after you have not eaten for a while (fasting). You may have this done every 1-3 years. Mammogram. This may be done every 1-2 years. Talk to your health care provider about when you should start having regular mammograms. This may depend on whether you have a family history of breast cancer. BRCA-related cancer screening. This may be done if you have a family history of breast, ovarian, tubal, or peritoneal cancers. Pelvic exam and Pap test. This may be done every 3 years starting at age 58. Starting at age 62, this may be done every 5 years if you have a Pap test in combination with an HPV test. Bone density scan. This is done to screen for osteoporosis. You may have this scan if you are at high risk for osteoporosis. Discuss your test results, treatment options, and if necessary, the need for more tests with your health care  provider. Vaccines  Your health care provider may recommend certain vaccines, such as: Influenza vaccine. This is recommended  every year. Tetanus, diphtheria, and acellular pertussis (Tdap, Td) vaccine. You may need a Td booster every 10 years. Zoster vaccine. You may need this after age 54. Pneumococcal 13-valent conjugate (PCV13) vaccine. You may need this if you have certain conditions and were not previously vaccinated. Pneumococcal polysaccharide (PPSV23) vaccine. You may need one or two doses if you smoke cigarettes or if you have certain conditions. Talk to your health care provider about which screenings and vaccines you need and how often you need them. This information is not intended to replace advice given to you by your health care provider. Make sure you discuss any questions you have with your health care provider. Document Released: 01/12/2016 Document Revised: 09/04/2016 Document Reviewed: 10/17/2015 Elsevier Interactive Patient Education  2017 [place] Prevention in the Home Falls can cause injuries. They can happen to people of all ages. There are many things you can do to make your home safe and to help prevent falls. What can I do on the outside of my home? Regularly fix the edges of walkways and driveways and fix any cracks. Remove anything that might make you trip as you walk through a door, such as a raised step or threshold. Trim any bushes or trees on the path to your home. Use bright outdoor lighting. Clear any walking paths of anything that might make someone trip, such as rocks or tools. Regularly check to see if handrails are loose or broken. Make sure that both sides of any steps have handrails. Any raised decks and porches should have guardrails on the edges. Have any leaves, snow, or ice cleared regularly. Use sand or salt on walking paths during winter. Clean up any spills in your garage right away. This includes oil or grease spills. What can I do in the bathroom? Use night lights. Install grab bars by the toilet and in the tub and shower. Do not use towel bars as  grab bars. Use non-skid mats or decals in the tub or shower. If you need to sit down in the shower, use a plastic, non-slip stool. Keep the floor dry. Clean up any water that spills on the floor as soon as it happens. Remove soap buildup in the tub or shower regularly. Attach bath mats securely with double-sided non-slip rug tape. Do not have throw rugs and other things on the floor that can make you trip. What can I do in the bedroom? Use night lights. Make sure that you have a light by your bed that is easy to reach. Do not use any sheets or blankets that are too big for your bed. They should not hang down onto the floor. Have a firm chair that has side arms. You can use this for support while you get dressed. Do not have throw rugs and other things on the floor that can make you trip. What can I do in the kitchen? Clean up any spills right away. Avoid walking on wet floors. Keep items that you use a lot in easy-to-reach places. If you need to reach something above you, use a strong step stool that has a grab bar. Keep electrical cords out of the way. Do not use floor polish or wax that makes floors slippery. If you must use wax, use non-skid floor wax. Do not have throw rugs and other things on the floor that can make  you trip. What can I do with my stairs? Do not leave any items on the stairs. Make sure that there are handrails on both sides of the stairs and use them. Fix handrails that are broken or loose. Make sure that handrails are as long as the stairways. Check any carpeting to make sure that it is firmly attached to the stairs. Fix any carpet that is loose or worn. Avoid having throw rugs at the top or bottom of the stairs. If you do have throw rugs, attach them to the floor with carpet tape. Make sure that you have a light switch at the top of the stairs and the bottom of the stairs. If you do not have them, ask someone to add them for you. What else can I do to help prevent  falls? Wear shoes that: Do not have high heels. Have rubber bottoms. Are comfortable and fit you well. Are closed at the toe. Do not wear sandals. If you use a stepladder: Make sure that it is fully opened. Do not climb a closed stepladder. Make sure that both sides of the stepladder are locked into place. Ask someone to hold it for you, if possible. Clearly mark and make sure that you can see: Any grab bars or handrails. First and last steps. Where the edge of each step is. Use tools that help you move around (mobility aids) if they are needed. These include: Canes. Walkers. Scooters. Crutches. Turn on the lights when you go into a dark area. Replace any light bulbs as soon as they burn out. Set up your furniture so you have a clear path. Avoid moving your furniture around. If any of your floors are uneven, fix them. If there are any pets around you, be aware of where they are. Review your medicines with your doctor. Some medicines can make you feel dizzy. This can increase your chance of falling. Ask your doctor what other things that you can do to help prevent falls. This information is not intended to replace advice given to you by your health care provider. Make sure you discuss any questions you have with your health care provider. Document Released: 10/12/2009 Document Revised: 05/23/2016 Document Reviewed: 01/20/2015 Elsevier Interactive Patient Education  2017 [employer].

## 2021-07-16 NOTE — Assessment & Plan Note (Signed)
Chronic, ongoing.  She denies SI/HI.  Continue collaboration with psychiatry and current medication regimen.   

## 2021-07-16 NOTE — Patient Instructions (Addendum)
[facility]  Address: [address], [place], [place] [address]  Phone: [phone]  Mammogram A mammogram is an X-ray of the breasts. This is done to check for changes that are not normal. This test can look for changes that may be caused by breast cancer or other problems. Mammograms are regularly done on women beginning at age 60. A man may have a mammogram if he has a lump or swelling in his breast. Tell a doctor: About any allergies you have. If you have breast implants. If you have had breast disease, biopsy, or surgery. If you have a family history of breast cancer. If you are breastfeeding. Whether you are pregnant or may be pregnant. What are the risks? Generally, this is a safe procedure. But problems may occur, including: Being exposed to radiation. Radiation levels are very low with this test. The need for more tests. The results were not read properly. Trouble finding breast cancer in women with dense breasts. What happens before the test? Have this test done about 1-2 weeks after your menstrual period. This is often when your breasts are the least tender. If you are visiting a new doctor or clinic, have any past mammogram images sent to your new doctor's office. Wash your breasts and under your arms on the day of the test. Do not use deodorants, perfumes, lotions, or powders on the day of the test. Take off any jewelry from your neck. Wear clothes that you can change into and out of easily. What happens during the test?  You will take off your clothes from the waist up. You will put on a gown. You will stand in front of the X-ray machine. Each breast will be placed between two plastic or glass plates. The plates will press down on your breast for a few seconds. Try to relax. This does not cause any harm to your breasts. It may not feel comfortable, but it will be very brief. X-rays will be taken from different angles of each  breast. The procedure may vary among doctors and hospitals. What can I expect after the test? The mammogram will be read by a specialist (radiologist). You may need to do parts of the test again. This depends on the quality of the images. You may go back to your normal activities. It is up to you to get the results of your test. Ask how to get your results when they are ready. Summary A mammogram is an X-ray of the breasts. It looks for changes that may be caused by breast cancer or other problems. A man may have this test if he has a lump or swelling in his breast. Before the test, tell your doctor about any breast problems that you have had in the past. Have this test done about 1-2 weeks after your menstrual period. Ask when your test results will be ready. Make sure you get your test results. This information is not intended to replace advice given to you by your health care provider. Make sure you discuss any questions you have with your health care provider. Document Revised: 10/16/2020 Document Reviewed: 10/16/2020 Elsevier Patient Education  2022 [employer]   

## 2021-07-16 NOTE — Assessment & Plan Note (Signed)
BMI 31.20.  Recommended eating smaller high protein, low fat meals more frequently and exercising 30 mins a day 5 times a week with a goal of 10-15lb weight loss in the next 3 months. Patient voiced their understanding and motivation to adhere to these recommendations.

## 2021-07-16 NOTE — Assessment & Plan Note (Signed)
Are of old sebaceous cyst present on exam, currently not enlarged and no s/s infection.  Mammogram ordered and highly recommend she obtain.

## 2021-07-16 NOTE — Progress Notes (Signed)
BP 126/78 (BP Location: Left Arm)   Pulse 98   Temp 99.2 F (37.3 C) (Oral)   Wt 170 lb 9.6 oz (77.4 kg)   LMP 03/28/2016   SpO2 97%   BMI 31.20 kg/m    Subjective:    Patient ID: Megan [name], female    DOB: 12-22-1961, 60 y.o.   MRN: [id number]  HPI: Megan [name] is a 60 y.o. female  Chief Complaint  Patient presents with   Hypertension   Follow-up    Patient is requesting lab work for Hepatitis screening.   Knee Pain    Patient states she in her right knee she states it feels like her bones are separating and she says it happen every 2-3 weeks. Patient states she is having pain in both knees but notices the feeling in the right knee. Patient think she might have Arthritis and inflammation all over her body.    Cyst    Patient states she has cyst between her breast and she mentioned it to the nurse at the ER and states that it has increased in size. Patient states the cyst has been there about 4-5 years.    PREDIABETES Last A1c was 5.7% in June 2021. No current medications. Hypoglycemic episodes: not checking Polydipsia/polyuria: no Visual disturbance: no Chest pain: no Paresthesias: no Retinal Examination: Not up to Date Foot Exam: Up to Date Pneumovax: Not up to Date Influenza: Up to Date Aspirin: no   ARTHRITIS PAIN To knees, arms, and shoulders. Worse in morning when she is stiff for 30 minutes. Duration: chronic Involved areas: knees, arms, and shoulders Mechanism of injury: unknown Location:right knee anterior, lower legs bilaterally, shoulders bilaterally, and arms Onset: gradual Severity: 10/10  Quality:  dull and aching Frequency: intermittent Radiation: no Aggravating factors: walking and movement  Alleviating factors: nothing  Status: fluctuating Treatments attempted: heat and APAP , NSAID Relief with NSAIDs?:  none Weakness with weight bearing or walking: no Sensation of giving way: yes Locking: yes Popping: yes Bruising:  no Swelling: yes Redness: no Paresthesias/decreased sensation: no Fevers: no   HISTORY OF BREAST CYST Has cyst that a little puss came out of in past to right breast, many years ago, and sometimes that area aches but has not returned. Duration :months Location: right Onset: gradual Severity: mild Quality: dull and aching Frequency: intermittent Redness: no Swelling: no Trauma: no trauma Breastfeeding: no Associated with menstral cycle: no Nipple discharge: no Breast lump: cyst Status: stable Treatments attempted: none Previous mammogram: no    HYPERTENSION She tried Losartan 25 MG, but this made her too dizzy.  Sister has a CVA at age 25.  Patient reports wishes not to take medication for HLD or HTN at this time. Hypertension status: stable Satisfied with current treatment? no Duration of hypertension: chronic BP monitoring frequency:  a few times a month BP range:  BP medication side effects:  no Medication compliance: good compliance Previous BP meds:Losartan Aspirin: no Recurrent headaches: no Visual changes: no Palpitations: no Dyspnea: no Chest pain: no Lower extremity edema: no Dizzy/lightheaded: no  The 10-year ASCVD risk score Mikey Bussing DC Jr., et al., 2013) is: 10%   Values used to calculate the score:     Age: 33 years     Sex: Female     Is Non-Hispanic African American: Yes     Diabetic: Yes     Tobacco smoker: No     Systolic Blood Pressure: 299 mmHg     Is BP treated:  No     HDL Cholesterol: 83 mg/dL     Total Cholesterol: 223 mg/dL    SCHIZOPHRENIA & PTSD Attends [facility] for psychiatric care.  Taking Trazodone 200 MG nightly, Effexor 150 MG daily, Invega 6 MG QHS.   Has been with [facility] since 2015, last visit one month ago.  Her mother struggled with mental health issues.   Mood status: stable Satisfied with current treatment?: yes Symptom severity: moderate  Duration of current treatment : chronic Side effects: no Medication compliance: good  compliance Psychotherapy/counseling: current Previous psychiatric medications: multiple different meds Depressed mood: yes Anxious mood: yes Anhedonia: no Significant weight loss or gain: no Insomnia: yes hard to fall asleep Fatigue: no Feelings of worthlessness or guilt: sometimes Impaired concentration/indecisiveness: yes Suicidal ideations: no Hopelessness: yes Crying spells: occasional Depression screen [facility] 2/9 07/16/2021 07/16/2021 07/14/2020 06/19/2020  Decreased Interest 2 0 3 2  Down, Depressed, Hopeless 2 0 0 2  PHQ - 2 Score 4 0 3 4  Altered sleeping 2 - 0 3  Tired, decreased energy 3 - 3 3  Change in appetite 3 - 0 0  Feeling bad or failure about yourself  2 - 3 3  Trouble concentrating 2 - 1 1  Moving slowly or fidgety/restless 0 - 0 0  Suicidal thoughts 0 - 0 0  PHQ-9 Score 16 - 10 14  Difficult doing work/chores Somewhat difficult - Somewhat difficult Not difficult at all     Relevant past medical, surgical, family and social history reviewed and updated as indicated. Interim medical history since our last visit reviewed. Allergies and medications reviewed and updated.  Review of Systems  Constitutional:  Negative for activity change, appetite change, diaphoresis, fatigue and fever.  Respiratory:  Negative for cough, chest tightness and shortness of breath.   Cardiovascular:  Negative for chest pain, palpitations and leg swelling.  Gastrointestinal: Negative.   Endocrine: Negative for polydipsia, polyphagia and polyuria.  Musculoskeletal:  Positive for arthralgias.  Neurological: Negative.   Psychiatric/Behavioral: Negative.     Per HPI unless specifically indicated above     Objective:    BP 126/78 (BP Location: Left Arm)   Pulse 98   Temp 99.2 F (37.3 C) (Oral)   Wt 170 lb 9.6 oz (77.4 kg)   LMP 03/28/2016   SpO2 97%   BMI 31.20 kg/m   Wt Readings from Last 3 Encounters:  07/16/21 170 lb 9.6 oz (77.4 kg)  07/16/21 180 lb (81.6 kg)  07/14/20 169  lb (76.7 kg)    Physical Exam Vitals and nursing note reviewed. Exam conducted with a chaperone present.  Constitutional:      General: She is awake. She is not in acute distress.    Appearance: She is well-developed and well-groomed. She is obese. She is not ill-appearing or toxic-appearing.  HENT:     Head: Normocephalic.     Right Ear: Hearing, tympanic membrane, ear canal and external ear normal.     Left Ear: Hearing, tympanic membrane, ear canal and external ear normal.  Eyes:     General: Lids are normal.        Right eye: No discharge.        Left eye: No discharge.     Conjunctiva/sclera: Conjunctivae normal.     Pupils: Pupils are equal, round, and reactive to light.  Neck:     Thyroid: No thyromegaly.     Vascular: No carotid bruit.  Cardiovascular:     Rate and Rhythm: Normal  rate and regular rhythm.     Heart sounds: Normal heart sounds. No murmur heard.   No gallop.  Pulmonary:     Effort: Pulmonary effort is normal. No accessory muscle usage or respiratory distress.     Breath sounds: Normal breath sounds.  Chest:  Breasts:    Right: Normal. No axillary adenopathy or supraclavicular adenopathy.     Left: Normal. No axillary adenopathy or supraclavicular adenopathy.    Abdominal:     General: Bowel sounds are normal.     Palpations: Abdomen is soft. There is no hepatomegaly or splenomegaly.  Musculoskeletal:     Right shoulder: Normal.     Left shoulder: Normal.     Cervical back: Normal range of motion and neck supple.     Right knee: Crepitus present. No swelling, effusion, erythema, ecchymosis or bony tenderness. Normal range of motion. Tenderness present over the patellar tendon.     Left knee: Normal.     Right lower leg: No edema.     Left lower leg: No edema.  Lymphadenopathy:     Cervical: No cervical adenopathy.     Upper Body:     Right upper body: No supraclavicular, axillary or pectoral adenopathy.     Left upper body: No supraclavicular,  axillary or pectoral adenopathy.  Skin:    General: Skin is warm and dry.  Neurological:     Mental Status: She is alert and oriented to person, place, and time.  Psychiatric:        Attention and Perception: Attention normal.        Mood and Affect: Mood normal.        Speech: Speech normal.        Behavior: Behavior normal. Behavior is cooperative.        Thought Content: Thought content normal.    Results for orders placed or performed in visit on 06/19/20  Microalbumin, Urine Waived  Result Value Ref Range   Microalb, Ur Waived 150 (H) 0 - 19 mg/L   Creatinine, Urine Waived 300 10 - 300 mg/dL   Microalb/Creat Ratio 30-300 (H) <30 mg/g  Lipid Panel w/o Chol/HDL Ratio  Result Value Ref Range   Cholesterol, Total 223 (H) 100 - 199 mg/dL   Triglycerides 224 (H) 0 - 149 mg/dL   HDL 83 >39 mg/dL   VLDL Cholesterol Cal 37 5 - 40 mg/dL   LDL Chol Calc (NIH) 103 (H) 0 - 99 mg/dL  TSH  Result Value Ref Range   TSH 0.496 0.450 - 4.500 uIU/mL  Comprehensive metabolic panel  Result Value Ref Range   Glucose 117 (H) 65 - 99 mg/dL   BUN 10 6 - 24 mg/dL   Creatinine, Ser 0.82 0.57 - 1.00 mg/dL   GFR calc non Af Amer 79 >59 mL/min/1.73   GFR calc Af Amer 91 >59 mL/min/1.73   BUN/Creatinine Ratio 12 9 - 23   Sodium 142 134 - 144 mmol/L   Potassium 4.2 3.5 - 5.2 mmol/L   Chloride 104 96 - 106 mmol/L   CO2 22 20 - 29 mmol/L   Calcium 9.2 8.7 - 10.2 mg/dL   Total Protein 6.8 6.0 - 8.5 g/dL   Albumin 4.5 3.8 - 4.9 g/dL   Globulin, Total 2.3 1.5 - 4.5 g/dL   Albumin/Globulin Ratio 2.0 1.2 - 2.2   Bilirubin Total 0.3 0.0 - 1.2 mg/dL   Alkaline Phosphatase 60 48 - 121 IU/L   AST 30 0 - 40 IU/L  ALT 27 0 - 32 IU/L  Bayer DCA Hb A1c Waived  Result Value Ref Range   HB A1C (BAYER DCA - WAIVED) 5.7 <7.0 %      Assessment & Plan:   Problem List Items Addressed This Visit       Musculoskeletal and Integument   Arthritis    Chronic, ongoing, suspect OA to knees and shoulder.  Right  knee is the worst, she does not wish to pursue imaging at this time.  Will check for underlying inflammatory disease -- CRP, ESR, ANA today.  Meloxicam 7.5 MG script sent -- to use as needed only.  Recommend use of heat as needed + OTC creams such as Voltaren gel or Icy/Hot.  Return to office in 6 months, sooner if worsening.       Relevant Medications   meloxicam (MOBIC) 7.5 MG tablet   Other Relevant Orders   CBC with Differential/Platelet   TSH   C-reactive protein   Sed Rate (ESR)   ANA w/Reflex if Positive     Other   Schizophrenia, undifferentiated (HCC) - Primary    Chronic, ongoing. Continue collaboration with psychiatry and current medication regimen.  She denies SI/HI.         PTSD (post-traumatic stress disorder)    Chronic, ongoing.  She denies SI/HI.  Continue collaboration with psychiatry and current medication regimen.         Elevated hemoglobin A1c measurement    Diet controlled at this time, history of Metformin use.  Check A1c today and if elevation restart medication and recheck in 3 months.  Recommend heavy focus on diabetic diet at home.  Return in 6 months at this time, unless labs show significant elevation.       Relevant Orders   HgB A1c   Preventative health care    Due for Hep C, HIV, pap, colonoscopy, mammogram screening -- all ordered today With elevated A1c due for PPSV23 and tetanus -- refuses these. Pap -- refuses this. Discussed with patient, wishes to think about this and plan to further discuss next visit.       Elevated blood pressure reading    At goal in office today, she has stopped Losartan due to dizziness.  Recommend continue diet focus, with DASH diet.  Check BP at home 3 days a week and report if consistent above 130/80.  If need to start medication consider Lisinopril 2.5 MG or 5 MG, low dose to start.  Check CBC, CMP, TSH today.  Return in 6 months.       Relevant Orders   CBC with Differential/Platelet   TSH   Obesity     BMI 31.20.  Recommended eating smaller high protein, low fat meals more frequently and exercising 30 mins a day 5 times a week with a goal of 10-15lb weight loss in the next 3 months. Patient voiced their understanding and motivation to adhere to these recommendations.        Elevated low density lipoprotein (LDL) cholesterol level    History of elevation, she reports she is fasting today.  Sister with history of CVA, discussed her current ASCVD 10% with her and recommend medication, but patient refuses at this time.  Continue to recommend statin use.       Relevant Orders   Comprehensive metabolic panel   Lipid Panel w/o Chol/HDL Ratio   History of cyst of breast    Are of old sebaceous cyst present on exam, currently not enlarged and  no s/s infection.  Mammogram ordered and highly recommend she obtain.       Other Visit Diagnoses     Hypothyroidism, unspecified type       History of thyroid abnormal labs reported -- will check TSH today and initiate medication as needed.   Relevant Orders   TSH   Vitamin D deficiency       Reports history of low Vit D level, check today and start supplement as needed.   Relevant Orders   VITAMIN D 25 Hydroxy (Vit-D Deficiency, Fractures)   Low hemoglobin       History of anemia reported by patient, check CBC today.   Relevant Orders   CBC with Differential/Platelet   Colon cancer screening       GI referral placed per patient request   Relevant Orders   Ambulatory referral to Gastroenterology   Need for hepatitis C screening test       Hep C screening on labs today, per guideline recommendations, Discussed with patient.   Relevant Orders   Hepatitis C antibody   Encounter for screening for HIV       HIV screening on labs today, per guideline recommendations, Discussed with patient.   Relevant Orders   HIV Antibody (routine testing w rflx)   Need for shingles vaccine       Shingrix ordered   Relevant Medications   Zoster Vaccine  Adjuvanted [facility]) injection   Zoster Vaccine Adjuvanted [facility]) injection (Start on 01/16/2022)        Follow up plan: Return in about 6 months (around 01/16/2022) for IFG, HTN, MOOD, HLD.

## 2021-07-16 NOTE — Progress Notes (Signed)
I connected with Megan ArbourSheila [name] today by telephone and verified that I am speaking with the correct person using two identifiers. Location patient: home Location provider: work Persons participating in the virtual visit: Megan ArbourSheila [name], Megan [name].   I discussed the limitations, risks, security and privacy concerns of performing an evaluation and management service by telephone and the availability of in person appointments. I also discussed with the patient that there may be a patient responsible charge related to this service. The patient expressed understanding and verbally consented to this telephonic visit.    Interactive audio and video telecommunications were attempted between this provider and patient, however failed, due to patient having technical difficulties OR patient did not have access to video capability.  We continued and completed visit with audio only.     Vital signs may be patient reported or missing.  Subjective:   Megan [name] is a 60 y.o. female who presents for Medicare Annual (Subsequent) preventive examination.  Review of Systems     Cardiac Risk Factors include: obesity (BMI >30kg/m2);sedentary lifestyle     Objective:    Today's Vitals   07/16/21 1031 07/16/21 1032  Weight: 180 lb (81.6 kg)   Height: 5\' 2"  (1.575 m)   PainSc:  8    Body mass index is 32.92 kg/m.  Advanced Directives 07/16/2021 07/14/2020 07/31/2019 06/28/2016  Does Patient Have a Medical Advance Directive? No No No No  Would patient like information on creating a medical advance directive? - - No - Patient declined Yes - Educational materials given  Some encounter information is confidential and restricted. Go to Review Flowsheets activity to see all data.    Current Medications (verified) Outpatient Encounter Medications as of 07/16/2021  Medication Sig   losartan (COZAAR) 25 MG tablet Take 1 tablet (25 mg total) by mouth daily.   paliperidone (INVEGA) 6 MG 24 hr  tablet Take 6 mg by mouth at bedtime.   traZODone (DESYREL) 100 MG tablet SMARTSIG:2 Tablet(s) By Mouth Every Night PRN   venlafaxine XR (EFFEXOR-XR) 150 MG 24 hr capsule Take 1 capsule (150 mg total) by mouth daily with breakfast.   metFORMIN (GLUCOPHAGE) 500 MG tablet Take 500 mg by mouth 2 (two) times daily. (Patient not taking: No sig reported)   No facility-administered encounter medications on file as of 07/16/2021.    Allergies (verified) Sulfa antibiotics   History: Past Medical History:  Diagnosis Date   Anxiety    Depression    Diabetes mellitus without complication (HCC)    Past Surgical History:  Procedure Laterality Date   CESAREAN SECTION     Family History  Problem Relation Age of Onset   Alzheimer's disease Mother    Dementia Sister    Cancer Brother    Diabetes Maternal Grandmother    Dementia Sister    Lupus Sister    Cancer Sister    Social History   Socioeconomic History   Marital status: Married    Spouse name: Not on file   Number of children: Not on file   Years of education: Not on file   Highest education level: Not on file  Occupational History   Not on file  Tobacco Use   Smoking status: Never   Smokeless tobacco: Former  Building services engineerVaping Use   Vaping Use: Never used  Substance and Sexual Activity   Alcohol use: Yes    Alcohol/week: 8.0 standard drinks    Types: 5 Shots of liquor, 3 Cans of beer per week  Drug use: Yes    Types: Marijuana    Comment: has quit   Sexual activity: Not Currently  Other Topics Concern   Not on file  Social History Narrative   Not on file   Social Determinants of Health   Financial Resource Strain: Low Risk    Difficulty of Paying Living Expenses: Not hard at all  Food Insecurity: Food Insecurity Present   Worried About Programme researcher, broadcasting/film/video in the Last Year: Sometimes true   Barista in the Last Year: Sometimes true  Transportation Needs: No Transportation Needs   Lack of Transportation (Medical):  No   Lack of Transportation (Non-Medical): No  Physical Activity: Inactive   Days of Exercise per Week: 0 days   Minutes of Exercise per Session: 0 min  Stress: Stress Concern Present   Feeling of Stress : To some extent  Social Connections: Not on file    Tobacco Counseling Counseling given: Not Answered   Clinical Intake:  Pre-visit preparation completed: Yes  Pain : 0-10 Pain Score: 8  Pain Type: Chronic pain Pain Location: Leg Pain Orientation: Left, Right Pain Descriptors / Indicators: Tightness Pain Onset: More than a month ago Pain Frequency: Constant     Nutritional Status: BMI > 30  Obese Nutritional Risks: None Diabetes: No  How often do you need to have someone help you when you read instructions, pamphlets, or other written materials from your doctor or pharmacy?: 1 - Never What is the last grade level you completed in school?: 11th grade  Diabetic? no  Interpreter Needed?: No  Information entered by :: NAllen [name]   Activities of Daily Living In your present state of health, do you have any difficulty performing the following activities: 07/16/2021  Hearing? N  Vision? N  Difficulty concentrating or making decisions? N  Walking or climbing stairs? Y  Dressing or bathing? N  Doing errands, shopping? N  Preparing Food and eating ? N  Using the Toilet? N  In the past six months, have you accidently leaked urine? N  Do you have problems with loss of bowel control? N  Managing your Medications? N  Managing your Finances? N  Housekeeping or managing your Housekeeping? N  Some recent data might be hidden    Patient Care Team: Marjie Skiff, NP as PCP - General (Nurse Practitioner)  Indicate any recent Medical Services you may have received from other than [facility] providers in the past year (date may be approximate).     Assessment:   This is a routine wellness examination for Megan [name].  Hearing/Vision screen Vision Screening - Comments:: No  regular eye exams  Dietary issues and exercise activities discussed: Current Exercise Habits: The patient does not participate in regular exercise at present   Goals Addressed             This Visit's Progress    Patient Stated       07/16/2021, no goals       Depression Screen PHQ 2/9 Scores 07/16/2021 07/14/2020 06/19/2020  PHQ - 2 Score 0 3 4  PHQ- 9 Score - 10 14    Fall Risk Fall Risk  07/16/2021 07/14/2020 06/19/2020  Falls in the past year? 1 0 0  Comment knees gave out - -  Number falls in past yr: 1 - 0  Injury with Fall? 0 - 0  Risk for fall due to : Impaired mobility;Medication side effect Medication side effect -  Follow up Falls  evaluation completed;Education provided;Falls prevention discussed Falls evaluation completed;Education provided;Falls prevention discussed Falls evaluation completed    FALL RISK PREVENTION PERTAINING TO THE HOME:  Any stairs in or around the home? No  If so, are there any without handrails?  N/a Home free of loose throw rugs in walkways, pet beds, electrical cords, etc? Yes  Adequate lighting in your home to reduce risk of falls? Yes   ASSISTIVE DEVICES UTILIZED TO PREVENT FALLS:  Life alert? No  Use of a cane, walker or w/c? No  Grab bars in the bathroom? No  Shower chair or bench in shower? Yes  Elevated toilet seat or a handicapped toilet? No   TIMED UP AND GO:  Was the test performed? No .       Cognitive Function:     6CIT Screen 07/16/2021 07/14/2020  What Year? 0 points 0 points  What month? 0 points 0 points  What time? 0 points 0 points  Count back from 20 0 points 0 points  Months in reverse 0 points 0 points  Repeat phrase 0 points 0 points  Total Score 0 0    Immunizations Immunization History  Administered Date(s) Administered   PFIZER(Purple Top)SARS-COV-2 Vaccination 04/25/2020, 05/16/2020    TDAP status: Due, Education has been provided regarding the importance of this vaccine. Advised may receive  this vaccine at local pharmacy or Health Dept. Aware to provide a copy of the vaccination record if obtained from local pharmacy or Health Dept. Verbalized acceptance and understanding.  Flu Vaccine status: Declined, Education has been provided regarding the importance of this vaccine but patient still declined. Advised may receive this vaccine at local pharmacy or Health Dept. Aware to provide a copy of the vaccination record if obtained from local pharmacy or Health Dept. Verbalized acceptance and understanding.  Pneumococcal vaccine status: Declined,  Education has been provided regarding the importance of this vaccine but patient still declined. Advised may receive this vaccine at local pharmacy or Health Dept. Aware to provide a copy of the vaccination record if obtained from local pharmacy or Health Dept. Verbalized acceptance and understanding.   Covid-19 vaccine status: Completed vaccines  Qualifies for Shingles Vaccine? Yes   Zostavax completed No   Shingrix Completed?: No.    Education has been provided regarding the importance of this vaccine. Patient has been advised to call insurance company to determine out of pocket expense if they have not yet received this vaccine. Advised may also receive vaccine at local pharmacy or Health Dept. Verbalized acceptance and understanding.  Screening Tests Health Maintenance  Topic Date Due   OPHTHALMOLOGY EXAM  Never done   HIV Screening  Never done   Hepatitis C Screening  Never done   TETANUS/TDAP  Never done   PAP SMEAR-Modifier  Never done   COLONOSCOPY (Pts 45-39yrs Insurance coverage will need to be confirmed)  Never done   MAMMOGRAM  Never done   Zoster Vaccines- Shingrix (1 of 2) Never done   COVID-19 Vaccine (3 - Booster for Pfizer series) 10/16/2020   HEMOGLOBIN A1C  12/19/2020   FOOT EXAM  06/19/2021   PNEUMOCOCCAL POLYSACCHARIDE VACCINE AGE 73-64 HIGH RISK  07/16/2022 (Originally 12/31/1962)   INFLUENZA VACCINE  07/30/2021    Pneumococcal Vaccine 79-73 Years old  Aged Out   HPV VACCINES  Aged Out    Health Maintenance  Health Maintenance Due  Topic Date Due   OPHTHALMOLOGY EXAM  Never done   HIV Screening  Never done   Hepatitis  C Screening  Never done   TETANUS/TDAP  Never done   PAP SMEAR-Modifier  Never done   COLONOSCOPY (Pts 45-64yrs Insurance coverage will need to be confirmed)  Never done   MAMMOGRAM  Never done   Zoster Vaccines- Shingrix (1 of 2) Never done   COVID-19 Vaccine (3 - Booster for Pfizer series) 10/16/2020   HEMOGLOBIN A1C  12/19/2020   FOOT EXAM  06/19/2021    Colorectal cancer screening: due  Mammogram status: Ordered today. Pt provided with contact info and advised to call to schedule appt.   Bone Density status: n/a  Lung Cancer Screening: (Low Dose CT Chest recommended if Age 64-80 years, 30 pack-year currently smoking OR have quit w/in 15years.) does not qualify.   Lung Cancer Screening Referral: no  Additional Screening:  Hepatitis C Screening: does qualify; Completed today  Vision Screening: Recommended annual ophthalmology exams for early detection of glaucoma and other disorders of the eye. Is the patient up to date with their annual eye exam?  No  Who is the provider or what is the name of the office in which the patient attends annual eye exams? none If pt is not established with a provider, would they like to be referred to a provider to establish care? No .   Dental Screening: Recommended annual dental exams for proper oral hygiene  Community Resource Referral / Chronic Care Management: CRR required this visit?  Yes   CCM required this visit?  No      Plan:     I have personally reviewed and noted the following in the patient's chart:   Medical and social history Use of alcohol, tobacco or illicit drugs  Current medications and supplements including opioid prescriptions.  Functional ability and status Nutritional status Physical  activity Advanced directives List of other physicians Hospitalizations, surgeries, and ER visits in previous 12 months Vitals Screenings to include cognitive, depression, and falls Referrals and appointments  In addition, I have reviewed and discussed with patient certain preventive protocols, quality metrics, and best practice recommendations. A written personalized care plan for preventive services as well as general preventive health recommendations were provided to patient.     Barb Merino, [name]   2/70/3500   Nurse Notes: Patient would like a colonoscopy. I was unable to enter it. She would like to have Hep C screening today.

## 2021-07-16 NOTE — Assessment & Plan Note (Signed)
Chronic, ongoing. Continue collaboration with psychiatry and current medication regimen.  She denies SI/HI.

## 2021-07-16 NOTE — Assessment & Plan Note (Signed)
Diet controlled at this time, history of Metformin use.  Check A1c today and if elevation restart medication and recheck in 3 months.  Recommend heavy focus on diabetic diet at home.  Return in 6 months at this time, unless labs show significant elevation.

## 2021-07-17 ENCOUNTER — Other Ambulatory Visit: Payer: Self-pay

## 2021-07-17 ENCOUNTER — Other Ambulatory Visit: Payer: Self-pay | Admitting: Nurse Practitioner

## 2021-07-17 DIAGNOSIS — E559 Vitamin D deficiency, unspecified: Secondary | ICD-10-CM

## 2021-07-17 DIAGNOSIS — R7989 Other specified abnormal findings of blood chemistry: Secondary | ICD-10-CM

## 2021-07-17 LAB — COMPREHENSIVE METABOLIC PANEL
ALT: 23 IU/L (ref 0–32)
AST: 19 IU/L (ref 0–40)
Albumin/Globulin Ratio: 1.5 (ref 1.2–2.2)
Albumin: 4.5 g/dL (ref 3.8–4.9)
Alkaline Phosphatase: 64 IU/L (ref 44–121)
BUN/Creatinine Ratio: 16 (ref 12–28)
BUN: 12 mg/dL (ref 8–27)
Bilirubin Total: 0.2 mg/dL (ref 0.0–1.2)
CO2: 21 mmol/L (ref 20–29)
Calcium: 9.3 mg/dL (ref 8.7–10.3)
Chloride: 101 mmol/L (ref 96–106)
Creatinine, Ser: 0.73 mg/dL (ref 0.57–1.00)
Globulin, Total: 3 g/dL (ref 1.5–4.5)
Glucose: 105 mg/dL — ABNORMAL HIGH (ref 65–99)
Potassium: 4.2 mmol/L (ref 3.5–5.2)
Sodium: 138 mmol/L (ref 134–144)
Total Protein: 7.5 g/dL (ref 6.0–8.5)
eGFR: 94 mL/min/{1.73_m2} (ref >59)

## 2021-07-17 LAB — LIPID PANEL W/O CHOL/HDL RATIO
Cholesterol, Total: 297 mg/dL — ABNORMAL HIGH (ref 100–199)
HDL: 72 mg/dL (ref >39)
LDL Chol Calc (NIH): 194 mg/dL — ABNORMAL HIGH (ref 0–99)
Triglycerides: 167 mg/dL — ABNORMAL HIGH (ref 0–149)
VLDL Cholesterol Cal: 31 mg/dL (ref 5–40)

## 2021-07-17 LAB — TSH: TSH: 0.239 u[IU]/mL — ABNORMAL LOW (ref 0.450–4.500)

## 2021-07-17 LAB — CBC WITH DIFFERENTIAL/PLATELET
Basophils Absolute: 0 10*3/uL (ref 0.0–0.2)
Basos: 1 %
EOS (ABSOLUTE): 0.1 10*3/uL (ref 0.0–0.4)
Eos: 1 %
Hematocrit: 41.3 % (ref 34.0–46.6)
Hemoglobin: 13.3 g/dL (ref 11.1–15.9)
Immature Grans (Abs): 0 10*3/uL (ref 0.0–0.1)
Immature Granulocytes: 0 %
Lymphocytes Absolute: 1.7 10*3/uL (ref 0.7–3.1)
Lymphs: 28 %
MCH: 26.2 pg — ABNORMAL LOW (ref 26.6–33.0)
MCHC: 32.2 g/dL (ref 31.5–35.7)
MCV: 82 fL (ref 79–97)
Monocytes Absolute: 0.5 10*3/uL (ref 0.1–0.9)
Monocytes: 8 %
Neutrophils Absolute: 3.7 10*3/uL (ref 1.4–7.0)
Neutrophils: 62 %
Platelets: 311 10*3/uL (ref 150–450)
RBC: 5.07 x10E6/uL (ref 3.77–5.28)
RDW: 13.7 % (ref 11.7–15.4)
WBC: 6 10*3/uL (ref 3.4–10.8)

## 2021-07-17 LAB — HEPATITIS C ANTIBODY: Hep C Virus Ab: <0.1 s/co ratio (ref 0.0–0.9)

## 2021-07-17 LAB — ANA W/REFLEX IF POSITIVE: Anti Nuclear Antibody (ANA): NEGATIVE

## 2021-07-17 LAB — HIV ANTIBODY (ROUTINE TESTING W REFLEX): HIV Screen 4th Generation wRfx: NONREACTIVE

## 2021-07-17 LAB — HEMOGLOBIN A1C
Est. average glucose Bld gHb Est-mCnc: 131 mg/dL
Hgb A1c MFr Bld: 6.2 % — ABNORMAL HIGH (ref 4.8–5.6)

## 2021-07-17 LAB — C-REACTIVE PROTEIN: CRP: 3 mg/L (ref 0–10)

## 2021-07-17 LAB — SEDIMENTATION RATE: Sed Rate: 33 mm/hr (ref 0–40)

## 2021-07-17 LAB — VITAMIN D 25 HYDROXY (VIT D DEFICIENCY, FRACTURES): Vit D, 25-Hydroxy: 16.4 ng/mL — ABNORMAL LOW (ref 30.0–100.0)

## 2021-07-17 MED ORDER — SUPREP BOWEL PREP KIT 17.5-3.13-1.6 GM/177ML PO SOLN: 1.0000 | ORAL | 0 refills | Status: DC

## 2021-07-17 MED ORDER — CHOLECALCIFEROL 1.25 MG (50000 UT) PO TABS: 1.0000 | ORAL_TABLET | ORAL | 4 refills | Status: DC

## 2021-07-17 NOTE — Progress Notes (Signed)
Good morning, please let Marcile know her labs have returned: - Hep C and HIV are negative - CBC shows no anemia - Kidney and liver function are normal - Vitamin D level is quite low, I am going to send in a weekly higher dosing for her to start taking. - A1c is in prediabetes range at 6.2%, creeping up more, please focus on diet and regular exercise, will recheck in 3 to 6 months. - Inflammatory labs are normal, no rheumatoid arthritis - Thyroid lab shows TSH on lower side, more hyperthyroid.  I would like her to return in 6 weeks for lab visit only to recheck this. - Cholesterol levels are quite elevated.  I would like to start her on medication for this, a statin called Rosuvastatin.  Would she be okay with this?  This is to help with stroke prevention.  If okay with this, let me know.  Any questions? Keep being awesome!!  Thank you for allowing me to participate in your care.  I appreciate you. Kindest regards, Mesha Schamberger

## 2021-07-19 ENCOUNTER — Telehealth: Payer: Self-pay | Admitting: Nurse Practitioner

## 2021-07-19 NOTE — Telephone Encounter (Signed)
   Telephone encounter was:  Successful.  07/19/2021 Name: Megan [name] MRN: [id number] DOB: 1961-08-29  Megan [name] is a 60 y.o. year old female who is a primary care patient of Cannady, Dorie Rank, NP . The community resource team was consulted for assistance with Food Insecurity  Care guide performed the following interventions: Patient provided with information about care guide support team and interviewed to confirm resource needs. I will mail list of local food banks and research any other resources for food and give pt a call back.  Follow Up Plan:  Care guide will follow up with patient by phone over the next week.  April Green Care Guide, Embedded Care Coordination [facility], Care Management Phone: [phone] Email: [email or web address]

## 2021-08-01 ENCOUNTER — Telehealth: Payer: Self-pay

## 2021-08-01 NOTE — Telephone Encounter (Signed)
Called to confirm patient would like to cancel her procedure. Patient states she does not have a ride. Endo unit has been notified of this change.

## 2021-08-02 ENCOUNTER — Ambulatory Visit: Admit: 2021-08-02 | Payer: Medicare (Managed Care) | Admitting: Gastroenterology

## 2021-08-02 SURGERY — COLONOSCOPY
Anesthesia: General

## 2021-08-30 ENCOUNTER — Telehealth: Payer: Self-pay | Admitting: Nurse Practitioner

## 2021-08-30 NOTE — Telephone Encounter (Signed)
   Telephone encounter was:  Unsuccessful.  08/30/2021 Name: Megan [name] MRN: [id number] DOB: [birth year]  Unsuccessful outbound call made today to assist with:  Food Insecurity  Outreach Attempt:  1st Attempt  Following up with patient to see if she received resources in the mail. A HIPAA compliant voice message was left requesting a return call.  Instructed patient to call back at [phone].  April Green Care Guide, Embedded Care Coordination [facility], Care Management Phone: [phone] Email: [email or web address]

## 2022-01-16 ENCOUNTER — Ambulatory Visit: Payer: Medicare (Managed Care) | Admitting: Nurse Practitioner

## 2022-02-05 ENCOUNTER — Other Ambulatory Visit: Payer: Self-pay | Admitting: Nurse Practitioner

## 2022-02-05 NOTE — Telephone Encounter (Signed)
Requested medication (s) are due for refill today: yes  Requested medication (s) are on the active medication list: yes  Last refill:  07/16/21 #30 with 5 RF  Future visit scheduled: canceled 01/16/22 and is not scheduled until 06/2022  Notes to clinic:  Office note states to only take as needed and to use heat and Voltaren, canceled follow up appt, please assess.  Requested Prescriptions  Pending Prescriptions Disp Refills   meloxicam (MOBIC) 7.5 MG tablet [Pharmacy Med Name: Meloxicam 7.5MG TABS] 30 tablet 5    Sig: Take 1 tablet (7.5 mg total) by mouth daily.     Analgesics:  COX2 Inhibitors Failed - 02/05/2022 10:46 AM      Failed - Manual Review: Labs are only required if the patient has taken medication for more than 8 weeks.      Passed - HGB in normal range and within 360 days    Hemoglobin  Date Value Ref Range Status  07/16/2021 13.3 11.1 - 15.9 g/dL Final          Passed - Cr in normal range and within 360 days    Creatinine, Ser  Date Value Ref Range Status  07/16/2021 0.73 0.57 - 1.00 mg/dL Final          Passed - HCT in normal range and within 360 days    Hematocrit  Date Value Ref Range Status  07/16/2021 41.3 34.0 - 46.6 % Final          Passed - AST in normal range and within 360 days    AST  Date Value Ref Range Status  07/16/2021 19 0 - 40 IU/L Final          Passed - ALT in normal range and within 360 days    ALT  Date Value Ref Range Status  07/16/2021 23 0 - 32 IU/L Final          Passed - eGFR is 30 or above and within 360 days    GFR calc Af Amer  Date Value Ref Range Status  06/19/2020 91 >59 mL/min/1.73 Final    Comment:    **[facility] currently reports eGFR in compliance with the current**   recommendations of the [employer]. [facility] will   update reporting as new guidelines are published from the NKF-ASN   Task force.    GFR calc non Af Amer  Date Value Ref Range Status  06/19/2020 79 >59 mL/min/1.73 Final    eGFR  Date Value Ref Range Status  07/16/2021 94 >59 mL/min/1.73 Final          Passed - Patient is not pregnant      Passed - Valid encounter within last 12 months    Recent Outpatient Visits           6 months ago Schizophrenia, undifferentiated ([facility])   [place], Moxee T, NP   1 year ago Encounter to establish care   [place], Barbaraann Faster, NP       Future Appointments             In 5 months [facility], [facility]

## 2022-02-18 ENCOUNTER — Telehealth: Payer: Self-pay | Admitting: Nurse Practitioner

## 2022-02-18 DIAGNOSIS — F203 Undifferentiated schizophrenia: Secondary | ICD-10-CM

## 2022-02-18 DIAGNOSIS — F4310 Post-traumatic stress disorder, unspecified: Secondary | ICD-10-CM

## 2022-02-18 NOTE — Telephone Encounter (Signed)
Patient was notified and informed that her referral was sent over to Psychiatrist and patient would like to have her medication sent over, but the doctor with [facility] sent her over a curtesy refill before she found another location to go.

## 2022-02-18 NOTE — Telephone Encounter (Signed)
Spoke with patient and she says that [facility] is the facility that isn't accepting her part D insurance. Patient says [facility] provider sent her in one more refill, but after this he will no longer send prescriptions. Patient states still has Medicaid and Medicare. Patient states she is needing another facility in order to get her medications. Please advise?

## 2022-02-18 NOTE — Telephone Encounter (Signed)
Left a message for patient to gather more information regarding Jolene's previous message. Advised patient to give our office a call back to discuss.

## 2022-02-18 NOTE — Telephone Encounter (Signed)
Patient called in states the [facility] she goes to doesn't acccept her insurance, and she they dnt accept her part D insurane, so she cant get her medicine. Plase cal back with further assistance,

## 2022-02-19 NOTE — Telephone Encounter (Signed)
Spoke with patient and she says the doctor sent over another month supply to hold her until her scheduled appointment with Megan [name] on 03/11/22. Patient verbalized understanding.

## 2022-02-19 NOTE — Telephone Encounter (Signed)
PT scheduled for fu appt 3/13

## 2022-03-07 ENCOUNTER — Telehealth: Payer: Self-pay | Admitting: Nurse Practitioner

## 2022-03-07 NOTE — Telephone Encounter (Signed)
Copied from CRM [id number]. Topic: General - Other ?>> Mar 07, 2022 11:39 AM Eliseo Gum, Deedra Ehrich wrote: ?Reason for CRM:pt called in just wanted to let Dr Harvest Dark know that the mental health Dr gave her 3 months of her meds ?

## 2022-03-11 ENCOUNTER — Ambulatory Visit: Payer: Medicare (Managed Care) | Admitting: Nurse Practitioner

## 2022-04-11 ENCOUNTER — Ambulatory Visit: Payer: Medicare (Managed Care) | Admitting: Nurse Practitioner

## 2022-06-17 ENCOUNTER — Ambulatory Visit: Payer: Self-pay

## 2022-06-17 NOTE — Telephone Encounter (Signed)
    Chief Complaint: Bilateral leg pain, Meloxicam not helping Symptoms: Above Frequency: "For a long time." Pertinent Negatives: Patient denies  Disposition: [] ED /[] Urgent Care (no appt availability in office) / [x] Appointment(In office/virtual)/ []  [facility] [place] [facility]/ [] Home Care/ [] Refused Recommended Disposition /[] [facility]/ []  Follow-up with PCP Additional Notes:   Reason for Disposition  [1] MODERATE pain (e.g., interferes with normal activities, limping) AND [2] present > 3 days  Answer Assessment - Initial Assessment Questions 1. ONSET: "When did the pain start?"      Always been like this 2. LOCATION: "Where is the pain located?"      Both legs 3. PAIN: "How bad is the pain?"    (Scale 1-10; or mild, moderate, severe)   -  MILD (1-3): doesn't interfere with normal activities    -  MODERATE (4-7): interferes with normal activities (e.g., work or school) or awakens from sleep, limping    -  SEVERE (8-10): excruciating pain, unable to do any normal activities, unable to walk     Now - 8 4. WORK OR EXERCISE: "Has there been any recent work or exercise that involved this part of the body?"      No 5. CAUSE: "What do you think is causing the leg pain?"     Inflammation 6. OTHER SYMPTOMS: "Do you have any other symptoms?" (e.g., chest pain, back pain, breathing difficulty, swelling, rash, fever, numbness, weakness)     No 7. PREGNANCY: "Is there any chance you are pregnant?" "When was your last menstrual period?"     No  Protocols used: Leg Pain-A-AH

## 2022-06-18 ENCOUNTER — Encounter: Payer: Self-pay | Admitting: Family Medicine

## 2022-06-18 ENCOUNTER — Ambulatory Visit
Admission: RE | Admit: 2022-06-18 | Discharge: 2022-06-18 | Disposition: A | Discharge status: Home / Self Care | Payer: 59 | Attending: Family Medicine | Admitting: Family Medicine

## 2022-06-18 ENCOUNTER — Ambulatory Visit (INDEPENDENT_AMBULATORY_CARE_PROVIDER_SITE_OTHER): Payer: 59 | Admitting: Family Medicine

## 2022-06-18 ENCOUNTER — Ambulatory Visit
Admission: RE | Admit: 2022-06-18 | Discharge: 2022-06-18 | Disposition: A | Discharge status: Home / Self Care | Payer: 59 | Source: Ambulatory Visit | Attending: Family Medicine | Admitting: Family Medicine

## 2022-06-18 VITALS — BP 137/86 | HR 92 | Temp 98.6°F | Ht 62.0 in | Wt 171.6 lb

## 2022-06-18 DIAGNOSIS — R7301 Impaired fasting glucose: Secondary | ICD-10-CM | POA: Insufficient documentation

## 2022-06-18 DIAGNOSIS — M25562 Pain in left knee: Secondary | ICD-10-CM

## 2022-06-18 DIAGNOSIS — G8929 Other chronic pain: Secondary | ICD-10-CM

## 2022-06-18 DIAGNOSIS — M545 Low back pain, unspecified: Secondary | ICD-10-CM | POA: Diagnosis present

## 2022-06-18 DIAGNOSIS — E669 Obesity, unspecified: Secondary | ICD-10-CM | POA: Insufficient documentation

## 2022-06-18 DIAGNOSIS — E7800 Pure hypercholesterolemia, unspecified: Secondary | ICD-10-CM | POA: Diagnosis not present

## 2022-06-18 DIAGNOSIS — M79671 Pain in right foot: Secondary | ICD-10-CM | POA: Diagnosis present

## 2022-06-18 DIAGNOSIS — M25561 Pain in right knee: Secondary | ICD-10-CM | POA: Insufficient documentation

## 2022-06-18 DIAGNOSIS — M79672 Pain in left foot: Secondary | ICD-10-CM

## 2022-06-18 DIAGNOSIS — E559 Vitamin D deficiency, unspecified: Secondary | ICD-10-CM

## 2022-06-18 DIAGNOSIS — R7989 Other specified abnormal findings of blood chemistry: Secondary | ICD-10-CM

## 2022-06-18 DIAGNOSIS — R030 Elevated blood-pressure reading, without diagnosis of hypertension: Secondary | ICD-10-CM

## 2022-06-18 LAB — URINALYSIS, ROUTINE W REFLEX MICROSCOPIC
Bilirubin, UA: NEGATIVE
Glucose, UA: NEGATIVE
Ketones, UA: NEGATIVE
Leukocytes,UA: NEGATIVE
Nitrite, UA: NEGATIVE
Protein,UA: NEGATIVE
RBC, UA: NEGATIVE
Specific Gravity, UA: >1.03 — ABNORMAL HIGH (ref 1.005–1.030)
Urobilinogen, Ur: 0.2 mg/dL (ref 0.2–1.0)
pH, UA: 5.5 (ref 5.0–7.5)

## 2022-06-18 LAB — MICROALBUMIN, URINE WAIVED
Creatinine, Urine Waived: 200 mg/dL (ref 10–300)
Microalb, Ur Waived: 30 mg/L — ABNORMAL HIGH (ref 0–19)
Microalb/Creat Ratio: <30 mg/g (ref <30)

## 2022-06-18 LAB — BAYER DCA HB A1C WAIVED: HB A1C (BAYER DCA - WAIVED): 5.6 % (ref 4.8–5.6)

## 2022-06-18 MED ORDER — KETOROLAC TROMETHAMINE 60 MG/2ML IM SOLN
60.0000 mg | Freq: Once | INTRAMUSCULAR | Status: AC
  Administered 2022-06-18: 60 mg via INTRAMUSCULAR

## 2022-06-18 MED ORDER — MELOXICAM 15 MG PO TABS: 15.0000 mg | ORAL_TABLET | Freq: Every day | ORAL | 1 refills | Status: DC

## 2022-06-18 NOTE — Progress Notes (Signed)
BP 137/86 (BP Location: Left Arm, Cuff Size: Normal)   Pulse 92   Temp 98.6 F (37 C) (Oral)   Ht 5\' 2"  (1.575 m)   Wt 171 lb 9.6 oz (77.8 kg)   LMP 03/28/2016   SpO2 95%   BMI 31.39 kg/m    Subjective:    Patient ID: 03/[id number], female    DOB: Jul 26, 1961, 61 y.o.   MRN: [id number]  HPI: Megan [name] is a 61 y.o. female  Chief Complaint  Patient presents with   Leg Pain    Patient is here for bilateral leg pain. Patient says her pain has been an outgoing issue for years. Patient states she is having pain in her legs, bottom of her left foot and in her lower back. Patient states this last episode started about 2 months ago. Patient says she is no longer receiving Meloxicam and would like to discuss treatment options with provider.    Impaired Fasting Glucose HbA1C:  Lab Results  Component Value Date   HGBA1C 5.6 06/18/2022   Duration of elevated blood sugar: unknown Polydipsia: no Polyuria: no Weight change: no Visual disturbance: no Glucose Monitoring: no Diabetic Education: Not Completed  HYPERTENSION / HYPERLIPIDEMIA Satisfied with current treatment? yes Duration of hypertension: BP has been up and down BP monitoring frequency: not checking Past BP meds: none Duration of hyperlipidemia: chronic Cholesterol medication side effects: not on anything Cholesterol supplements: none Past cholesterol medications: none Medication compliance: not on anything Aspirin: no Recent stressors: yes Recurrent headaches: no Visual changes: no Palpitations: no Dyspnea: no Chest pain: no Lower extremity edema: no Dizzy/lightheaded: no  LEG PAIN Duration: 2-3 years Pain: yes Severity: moderate  Quality:  aching and separating Location:  back, knees, feet Bilateral:  yes Onset: gradual Frequency: constant Time of  day:   at random Paresthesias:   no Decreased sensation:  no Weakness:   no Status: worse  Relevant past medical, surgical, family and  social history reviewed and updated as indicated. Interim medical history since our last visit reviewed. Allergies and medications reviewed and updated.  Review of Systems  Constitutional: Negative.   Respiratory: Negative.    Cardiovascular: Negative.   Gastrointestinal: Negative.   Musculoskeletal:  Positive for arthralgias, back pain and myalgias. Negative for gait problem, joint swelling, neck pain and neck stiffness.  Skin: Negative.   Psychiatric/Behavioral: Negative.      Per HPI unless specifically indicated above     Objective:    BP 137/86 (BP Location: Left Arm, Cuff Size: Normal)   Pulse 92   Temp 98.6 F (37 C) (Oral)   Ht 5\' 2"  (1.575 m)   Wt 171 lb 9.6 oz (77.8 kg)   LMP 03/28/2016   SpO2 95%   BMI 31.39 kg/m   Wt Readings from Last 3 Encounters:  06/18/22 171 lb 9.6 oz (77.8 kg)  07/16/21 170 lb 9.6 oz (77.4 kg)  07/16/21 180 lb (81.6 kg)    Physical Exam Vitals and nursing note reviewed.  Constitutional:      General: She is not in acute distress.    Appearance: Normal appearance. She is not ill-appearing, toxic-appearing or diaphoretic.  HENT:     Head: Normocephalic and atraumatic.     Right Ear: External ear normal.     Left Ear: External ear normal.     Nose: Nose normal.     Mouth/Throat:     Mouth: Mucous membranes are moist.     Pharynx: Oropharynx  is clear.  Eyes:     General: No scleral icterus.       Right eye: No discharge.        Left eye: No discharge.     Extraocular Movements: Extraocular movements intact.     Conjunctiva/sclera: Conjunctivae normal.     Pupils: Pupils are equal, round, and reactive to light.  Cardiovascular:     Rate and Rhythm: Normal rate and regular rhythm.     Pulses: Normal pulses.     Heart sounds: Normal heart sounds. No murmur heard.    No friction rub. No gallop.  Pulmonary:     Effort: Pulmonary effort is normal. No respiratory distress.     Breath sounds: Normal breath sounds. No stridor. No  wheezing, rhonchi or rales.  Chest:     Chest wall: No tenderness.  Musculoskeletal:        General: Normal range of motion.     Cervical back: Normal range of motion and neck supple.  Skin:    General: Skin is warm and dry.     Capillary Refill: Capillary refill takes less than 2 seconds.     Coloration: Skin is not jaundiced or pale.     Findings: No bruising, erythema, lesion or rash.  Neurological:     General: No focal deficit present.     Mental Status: She is alert and oriented to person, place, and time. Mental status is at baseline.  Psychiatric:        Mood and Affect: Mood normal.        Behavior: Behavior normal.        Thought Content: Thought content normal.        Judgment: Judgment normal.     Results for orders placed or performed in visit on 06/18/22  Bayer DCA Hb A1c Waived  Result Value Ref Range   HB A1C (BAYER DCA - WAIVED) 5.6 4.8 - 5.6 %  Microalbumin, Urine Waived  Result Value Ref Range   Microalb, Ur Waived 30 (H) 0 - 19 mg/L   Creatinine, Urine Waived 200 10 - 300 mg/dL   Microalb/Creat Ratio <30 <30 mg/g  Urinalysis, Routine w reflex microscopic  Result Value Ref Range   Specific Gravity, UA >1.030 (H) 1.005 - 1.030   pH, UA 5.5 5.0 - 7.5   Color, UA Yellow Yellow   Appearance Ur Clear Clear   Leukocytes,UA Negative Negative   Protein,UA Negative Negative/Trace   Glucose, UA Negative Negative   Ketones, UA Negative Negative   RBC, UA Negative Negative   Bilirubin, UA Negative Negative   Urobilinogen, Ur 0.2 0.2 - 1.0 mg/dL   Nitrite, UA Negative Negative      Assessment & Plan:   Problem List Items Addressed This Visit       Endocrine   IFG (impaired fasting glucose)    Rechecking labs today as patient has been lost to follow up for about a year. Await results. Treat as needed.       Relevant Orders   Comprehensive metabolic panel   CBC with Differential/Platelet   Bayer DCA Hb A1c Waived (Completed)   Microalbumin, Urine  Waived (Completed)     Other   Elevated blood pressure reading    Better on recheck. Will check labs and continue to monitor. Due to see PCP next month.      Relevant Orders   Comprehensive metabolic panel   CBC with Differential/Platelet   Microalbumin, Urine Waived (Completed)  Urinalysis, Routine w reflex microscopic (Completed)   Elevated low density lipoprotein (LDL) cholesterol level    Rechecking labs today. Await results. Treat as needed. Follow up with PCP next month.       Relevant Orders   Comprehensive metabolic panel   CBC with Differential/Platelet   Lipid Panel w/o Chol/HDL Ratio   Other Visit Diagnoses     Chronic bilateral low back pain without sciatica    -  Primary   No imaging since 2017. Will repeat imaging and start meloxicam. Advised to only take 1 a day. Follow up with PCP next month.    Relevant Medications   meloxicam (MOBIC) 15 MG tablet   ketorolac (TORADOL) injection 60 mg (Completed)   Other Relevant Orders   DG Lumbar Spine Complete (Completed)   Bilateral foot pain       Chronic. Will get imaging and start meloxicam. Advised to only take 1 a day. Follow up with PCP next month.    Relevant Medications   ketorolac (TORADOL) injection 60 mg (Completed)   Other Relevant Orders   DG Foot Complete Left   DG Foot Complete Right   Chronic pain of both knees       Chronic. Will get imaging and start meloxicam. Advised to only take 1 a day. Follow up with PCP next month.    Relevant Medications   meloxicam (MOBIC) 15 MG tablet   ketorolac (TORADOL) injection 60 mg (Completed)   Other Relevant Orders   DG Knee Complete 4 Views Left   DG Knee Complete 4 Views Right   Low TSH level       Rechecking labs today. await results. Treat as needed.    Relevant Orders   Comprehensive metabolic panel   CBC with Differential/Platelet   TSH   Vitamin D deficiency       Relevant Orders   Comprehensive metabolic panel   CBC with Differential/Platelet    VITAMIN D 25 Hydroxy (Vit-D Deficiency, Fractures)        Follow up plan: Return As scheduled.

## 2022-06-18 NOTE — Assessment & Plan Note (Signed)
Rechecking labs today as patient has been lost to follow up for about a year. Await results. Treat as needed.

## 2022-06-18 NOTE — Assessment & Plan Note (Signed)
Better on recheck. Will check labs and continue to monitor. Due to see PCP next month.

## 2022-06-18 NOTE — Assessment & Plan Note (Signed)
Rechecking labs today. Await results. Treat as needed. Follow up with PCP next month.

## 2022-06-19 ENCOUNTER — Ambulatory Visit: Payer: Self-pay | Admitting: *Deleted

## 2022-06-19 ENCOUNTER — Encounter: Payer: Self-pay | Admitting: Family Medicine

## 2022-06-19 DIAGNOSIS — E559 Vitamin D deficiency, unspecified: Secondary | ICD-10-CM | POA: Insufficient documentation

## 2022-06-19 LAB — COMPREHENSIVE METABOLIC PANEL
ALT: 43 IU/L — ABNORMAL HIGH (ref 0–32)
AST: 21 IU/L (ref 0–40)
Albumin/Globulin Ratio: 1.6 (ref 1.2–2.2)
Albumin: 4.6 g/dL (ref 3.8–4.8)
Alkaline Phosphatase: 62 IU/L (ref 44–121)
BUN/Creatinine Ratio: 16 (ref 12–28)
BUN: 14 mg/dL (ref 8–27)
Bilirubin Total: <0.2 mg/dL (ref 0.0–1.2)
CO2: 22 mmol/L (ref 20–29)
Calcium: 9.6 mg/dL (ref 8.7–10.3)
Chloride: 103 mmol/L (ref 96–106)
Creatinine, Ser: 0.89 mg/dL (ref 0.57–1.00)
Globulin, Total: 2.8 g/dL (ref 1.5–4.5)
Glucose: 124 mg/dL — ABNORMAL HIGH (ref 70–99)
Potassium: 4.2 mmol/L (ref 3.5–5.2)
Sodium: 140 mmol/L (ref 134–144)
Total Protein: 7.4 g/dL (ref 6.0–8.5)
eGFR: 74 mL/min/{1.73_m2} (ref >59)

## 2022-06-19 LAB — LIPID PANEL W/O CHOL/HDL RATIO
Cholesterol, Total: 274 mg/dL — ABNORMAL HIGH (ref 100–199)
HDL: 70 mg/dL (ref >39)
LDL Chol Calc (NIH): 160 mg/dL — ABNORMAL HIGH (ref 0–99)
Triglycerides: 241 mg/dL — ABNORMAL HIGH (ref 0–149)
VLDL Cholesterol Cal: 44 mg/dL — ABNORMAL HIGH (ref 5–40)

## 2022-06-19 LAB — CBC WITH DIFFERENTIAL/PLATELET
Basophils Absolute: 0 10*3/uL (ref 0.0–0.2)
Basos: 1 %
EOS (ABSOLUTE): 0.3 10*3/uL (ref 0.0–0.4)
Eos: 5 %
Hematocrit: 41.2 % (ref 34.0–46.6)
Hemoglobin: 13.1 g/dL (ref 11.1–15.9)
Immature Grans (Abs): 0 10*3/uL (ref 0.0–0.1)
Immature Granulocytes: 0 %
Lymphocytes Absolute: 2.3 10*3/uL (ref 0.7–3.1)
Lymphs: 36 %
MCH: 26.3 pg — ABNORMAL LOW (ref 26.6–33.0)
MCHC: 31.8 g/dL (ref 31.5–35.7)
MCV: 83 fL (ref 79–97)
Monocytes Absolute: 0.5 10*3/uL (ref 0.1–0.9)
Monocytes: 8 %
Neutrophils Absolute: 3.3 10*3/uL (ref 1.4–7.0)
Neutrophils: 50 %
Platelets: 283 10*3/uL (ref 150–450)
RBC: 4.98 x10E6/uL (ref 3.77–5.28)
RDW: 14.7 % (ref 11.7–15.4)
WBC: 6.4 10*3/uL (ref 3.4–10.8)

## 2022-06-19 LAB — VITAMIN D 25 HYDROXY (VIT D DEFICIENCY, FRACTURES): Vit D, 25-Hydroxy: 16.3 ng/mL — ABNORMAL LOW (ref 30.0–100.0)

## 2022-06-19 LAB — TSH: TSH: 0.666 u[IU]/mL (ref 0.450–4.500)

## 2022-06-19 MED ORDER — VITAMIN D (ERGOCALCIFEROL) 1.25 MG (50000 UNIT) PO CAPS: 50000.0000 [IU] | ORAL_CAPSULE | ORAL | 1 refills | Status: DC

## 2022-06-19 NOTE — Telephone Encounter (Signed)
Summary: blood protein high pt questions   Pt went to donate blood yesterday after having blood work done at the office and Dr Shela Commons told her everything came back ok and now her blood protein is 9.2 and Dr Shela Commons says ok, agent assuming at  labs were not back  at time of appt,but pls fu with pt as she wants an answer as needing to go back over this am to donate blood. FU at [phone]     Patient donates blood plasma and was told yesterday after her office visit that she had protein in her urine and she could not donate. Patient states she is going back over to try to donate again and if it is still showing protein- she wants to be checked at office. Patient states she is staying hydrated. Reason for Disposition  [1] Follow-up call to recent contact AND [2] information only call, no triage required  Answer Assessment - Initial Assessment Questions 1. REASON FOR CALL or QUESTION: "What is your reason for calling today?" or "How can I best help you?" or "What question do you have that I can help answer?"     Patient is concerned about the Plasma donation center telling her she had protein in her urine after she was cleared to donated by Dr Laural Benes.Patient advised this can happen with high BP and diabetes, dehydration. Patient states she has been taking good care of herself and these things should not be cause. Patient states she is going back today and if they refuse her again- she may come to office for recheck- advised she may need appointment/order for that but she keeps repeating she is coming to office if they refuse her again today.  Protocols used: Information Only Call - No Triage-A-AH

## 2022-06-19 NOTE — Telephone Encounter (Signed)
FYI to provider. Protein in urine was negative on yesterdays lab work.

## 2022-06-20 ENCOUNTER — Other Ambulatory Visit: Payer: Self-pay | Admitting: Family Medicine

## 2022-06-20 DIAGNOSIS — M79671 Pain in right foot: Secondary | ICD-10-CM

## 2022-07-08 ENCOUNTER — Telehealth: Payer: Self-pay

## 2022-07-08 NOTE — Telephone Encounter (Signed)
Copied from CRM [id number]. Topic: General - Other >> Jul 05, 2022  3:45 PM Ja-Kwan M wrote: Reason for CRM: Pt stated she was referred to a mental health location and she has an appt this month but when she reached out to that office she was told to contact pcp and have pcp contact them. Cb# [phone]   Per Brayton Caves, nothing further is needed.

## 2022-07-14 NOTE — Patient Instructions (Addendum)
Please call to schedule your mammogram and/or bone density: [facility]  Address: [address], [place], [place] [address] Phone: [phone]   PODIATRY ==  [phone]  DASH Eating Plan DASH stands for Dietary Approaches to Stop Hypertension. The DASH eating plan is a healthy eating plan that has been shown to: Reduce high blood pressure (hypertension). Reduce your risk for type 2 diabetes, heart disease, and stroke. Help with weight loss. What are tips for following this plan? Reading food labels Check food labels for the amount of salt (sodium) per serving. Choose foods with less than 5 percent of the Daily Value of sodium. Generally, foods with less than 300 milligrams (mg) of sodium per serving fit into this eating plan. To find whole grains, look for the word "whole" as the first word in the ingredient list. Shopping Buy products labeled as "low-sodium" or "no salt added." Buy fresh foods. Avoid canned foods and pre-made or frozen meals. Cooking Avoid adding salt when cooking. Use salt-free seasonings or herbs instead of table salt or sea salt. Check with your health care provider or pharmacist before using salt substitutes. Do not fry foods. Cook foods using healthy methods such as baking, boiling, grilling, roasting, and broiling instead. Cook with heart-healthy oils, such as olive, canola, avocado, soybean, or sunflower oil. Meal planning  Eat a balanced diet that includes: 4 or more servings of fruits and 4 or more servings of vegetables each day. Try to fill one-half of your plate with fruits and vegetables. 6-8 servings of whole grains each day. Less than 6 oz (170 g) of lean meat, poultry, or fish each day. A 3-oz (85-g) serving of meat is about the same size as a deck of cards. One egg equals 1 oz (28 g). 2-3 servings of low-fat dairy each day. One serving is 1 cup (237 mL). 1 serving of nuts, seeds, or beans 5 times each  week. 2-3 servings of heart-healthy fats. Healthy fats called omega-3 fatty acids are found in foods such as walnuts, flaxseeds, fortified milks, and eggs. These fats are also found in cold-water fish, such as sardines, salmon, and mackerel. Limit how much you eat of: Canned or prepackaged foods. Food that is high in trans fat, such as some fried foods. Food that is high in saturated fat, such as fatty meat. Desserts and other sweets, sugary drinks, and other foods with added sugar. Full-fat dairy products. Do not salt foods before eating. Do not eat more than 4 egg yolks a week. Try to eat at least 2 vegetarian meals a week. Eat more home-cooked food and less restaurant, buffet, and fast food. Lifestyle When eating at a restaurant, ask that your food be prepared with less salt or no salt, if possible. If you drink alcohol: Limit how much you use to: 0-1 drink a day for women who are not pregnant. 0-2 drinks a day for men. Be aware of how much alcohol is in your drink. In the [place] one drink equals one 12 oz bottle of beer (355 mL), one 5 oz glass of wine (148 mL), or one 1 oz glass of hard liquor (44 mL). General information Avoid eating more than 2,300 mg of salt a day. If you have hypertension, you may need to reduce your sodium intake to 1,500 mg a day. Work with your health care provider to maintain a healthy body weight or to lose weight. Ask what an ideal weight is for you.  Get at least 30 minutes of exercise that causes your heart to beat faster (aerobic exercise) most days of the week. Activities may include walking, swimming, or biking. Work with your health care provider or dietitian to adjust your eating plan to your individual calorie needs. What foods should I eat? Fruits All fresh, dried, or frozen fruit. Canned fruit in natural juice (without added sugar). Vegetables Fresh or frozen vegetables (raw, steamed, roasted, or grilled). Low-sodium or reduced-sodium tomato and  vegetable juice. Low-sodium or reduced-sodium tomato sauce and tomato paste. Low-sodium or reduced-sodium canned vegetables. Grains Whole-grain or whole-wheat bread. Whole-grain or whole-wheat pasta. Brown rice. Orpah Cobb. Bulgur. Whole-grain and low-sodium cereals. Pita bread. Low-fat, low-sodium crackers. Whole-wheat flour tortillas. Meats and other proteins Skinless chicken or [place]. Ground chicken or [place]. Pork with fat trimmed off. Fish and seafood. Egg whites. Dried beans, peas, or lentils. Unsalted nuts, nut butters, and seeds. Unsalted canned beans. Lean cuts of beef with fat trimmed off. Low-sodium, lean precooked or cured meat, such as sausages or meat loaves. Dairy Low-fat (1%) or fat-free (skim) milk. Reduced-fat, low-fat, or fat-free cheeses. Nonfat, low-sodium ricotta or cottage cheese. Low-fat or nonfat yogurt. Low-fat, low-sodium cheese. Fats and oils Soft margarine without trans fats. Vegetable oil. Reduced-fat, low-fat, or light mayonnaise and salad dressings (reduced-sodium). Canola, safflower, olive, avocado, soybean, and sunflower oils. Avocado. Seasonings and condiments Herbs. Spices. Seasoning mixes without salt. Other foods Unsalted popcorn and pretzels. Fat-free sweets. The items listed above may not be a complete list of foods and beverages you can eat. Contact a dietitian for more information. What foods should I avoid? Fruits Canned fruit in a light or heavy syrup. Fried fruit. Fruit in cream or butter sauce. Vegetables Creamed or fried vegetables. Vegetables in a cheese sauce. Regular canned vegetables (not low-sodium or reduced-sodium). Regular canned tomato sauce and paste (not low-sodium or reduced-sodium). Regular tomato and vegetable juice (not low-sodium or reduced-sodium). Rosita Fire. Olives. Grains Baked goods made with fat, such as croissants, muffins, or some breads. Dry pasta or rice meal packs. Meats and other proteins Fatty cuts of meat. Ribs.  Fried meat. Tomasa Blase. Bologna, salami, and other precooked or cured meats, such as sausages or meat loaves. Fat from the back of a pig (fatback). Bratwurst. Salted nuts and seeds. Canned beans with added salt. Canned or smoked fish. Whole eggs or egg yolks. Chicken or [place] with skin. Dairy Whole or 2% milk, cream, and half-and-half. Whole or full-fat cream cheese. Whole-fat or sweetened yogurt. Full-fat cheese. Nondairy creamers. Whipped toppings. Processed cheese and cheese spreads. Fats and oils Butter. Stick margarine. Lard. Shortening. Ghee. Bacon fat. Tropical oils, such as coconut, palm kernel, or palm oil. Seasonings and condiments Onion salt, garlic salt, seasoned salt, table salt, and sea salt. Worcestershire sauce. Tartar sauce. Barbecue sauce. Teriyaki sauce. Soy sauce, including reduced-sodium. Steak sauce. Canned and packaged gravies. Fish sauce. Oyster sauce. Cocktail sauce. Store-bought horseradish. Ketchup. Mustard. Meat flavorings and tenderizers. Bouillon cubes. Hot sauces. Pre-made or packaged marinades. Pre-made or packaged taco seasonings. Relishes. Regular salad dressings. Other foods Salted popcorn and pretzels. The items listed above may not be a complete list of foods and beverages you should avoid. Contact a dietitian for more information. Where to find more information National Heart, Lung, and Blood Institute: PopSteam.is American Heart Association: www.heart.org Academy of Nutrition and Dietetics: www.eatright.org National Kidney Foundation: www.kidney.org Summary The DASH eating plan is a healthy eating plan that has been shown to reduce high blood pressure (hypertension). It may also  reduce your risk for type 2 diabetes, heart disease, and stroke. When on the DASH eating plan, aim to eat more fresh fruits and vegetables, whole grains, lean proteins, low-fat dairy, and heart-healthy fats. With the DASH eating plan, you should limit salt (sodium) intake to 2,300 mg  a day. If you have hypertension, you may need to reduce your sodium intake to 1,500 mg a day. Work with your health care provider or dietitian to adjust your eating plan to your individual calorie needs. This information is not intended to replace advice given to you by your health care provider. Make sure you discuss any questions you have with your health care provider. Document Revised: 11/19/2019 Document Reviewed: 11/19/2019 Elsevier Patient Education  2023 [employer].

## 2022-07-19 ENCOUNTER — Other Ambulatory Visit (HOSPITAL_COMMUNITY)
Admission: RE | Admit: 2022-07-19 | Discharge: 2022-07-19 | Disposition: A | Discharge status: Home / Self Care | Payer: 59 | Source: Ambulatory Visit | Attending: Nurse Practitioner | Admitting: Nurse Practitioner

## 2022-07-19 ENCOUNTER — Ambulatory Visit: Payer: Medicare (Managed Care)

## 2022-07-19 ENCOUNTER — Encounter: Payer: Self-pay | Admitting: Nurse Practitioner

## 2022-07-19 ENCOUNTER — Ambulatory Visit (INDEPENDENT_AMBULATORY_CARE_PROVIDER_SITE_OTHER): Payer: 59 | Admitting: Nurse Practitioner

## 2022-07-19 VITALS — BP 111/69 | HR 97 | Temp 98.7°F | Ht 63.5 in | Wt 171.2 lb

## 2022-07-19 DIAGNOSIS — Z872 Personal history of diseases of the skin and subcutaneous tissue: Secondary | ICD-10-CM

## 2022-07-19 DIAGNOSIS — Z1231 Encounter for screening mammogram for malignant neoplasm of breast: Secondary | ICD-10-CM | POA: Diagnosis not present

## 2022-07-19 DIAGNOSIS — Z01419 Encounter for gynecological examination (general) (routine) without abnormal findings: Secondary | ICD-10-CM | POA: Insufficient documentation

## 2022-07-19 DIAGNOSIS — M1990 Unspecified osteoarthritis, unspecified site: Secondary | ICD-10-CM

## 2022-07-19 DIAGNOSIS — Z1151 Encounter for screening for human papillomavirus (HPV): Secondary | ICD-10-CM | POA: Insufficient documentation

## 2022-07-19 DIAGNOSIS — R030 Elevated blood-pressure reading, without diagnosis of hypertension: Secondary | ICD-10-CM | POA: Diagnosis not present

## 2022-07-19 DIAGNOSIS — R7301 Impaired fasting glucose: Secondary | ICD-10-CM

## 2022-07-19 DIAGNOSIS — F4310 Post-traumatic stress disorder, unspecified: Secondary | ICD-10-CM

## 2022-07-19 DIAGNOSIS — Z23 Encounter for immunization: Secondary | ICD-10-CM

## 2022-07-19 DIAGNOSIS — E6609 Other obesity due to excess calories: Secondary | ICD-10-CM

## 2022-07-19 DIAGNOSIS — Z124 Encounter for screening for malignant neoplasm of cervix: Secondary | ICD-10-CM

## 2022-07-19 DIAGNOSIS — Z1211 Encounter for screening for malignant neoplasm of colon: Secondary | ICD-10-CM

## 2022-07-19 DIAGNOSIS — E782 Mixed hyperlipidemia: Secondary | ICD-10-CM

## 2022-07-19 DIAGNOSIS — Z0000 Encounter for general adult medical examination without abnormal findings: Secondary | ICD-10-CM

## 2022-07-19 DIAGNOSIS — F203 Undifferentiated schizophrenia: Secondary | ICD-10-CM

## 2022-07-19 DIAGNOSIS — E559 Vitamin D deficiency, unspecified: Secondary | ICD-10-CM

## 2022-07-19 DIAGNOSIS — Z6831 Body mass index (BMI) 31.0-31.9, adult: Secondary | ICD-10-CM

## 2022-07-19 MED ORDER — FLUCONAZOLE 150 MG PO TABS: 150.0000 mg | ORAL_TABLET | Freq: Once | ORAL | 0 refills | Status: AC

## 2022-07-19 MED ORDER — VITAMIN D (ERGOCALCIFEROL) 1.25 MG (50000 UNIT) PO CAPS: 50000.0000 [IU] | ORAL_CAPSULE | ORAL | 4 refills | Status: DC

## 2022-07-19 NOTE — Assessment & Plan Note (Signed)
Chronic, ongoing. Continue collaboration with psychiatry and current medication regimen. Denies SI/HI.

## 2022-07-19 NOTE — Assessment & Plan Note (Signed)
Chronic, ongoing.  She denies SI/HI.  Continue collaboration with psychiatry and current medication regimen.   

## 2022-07-19 NOTE — Assessment & Plan Note (Signed)
Chronic, ongoing, suspect OA to knees. Continue Meloxicam use as needed only.  Recommend use of heat as needed + OTC creams such as Voltaren gel or Icy/Hot. Return as needed.

## 2022-07-19 NOTE — Assessment & Plan Note (Signed)
BMI 29.85.  Recommended eating smaller high protein, low fat meals more frequently and exercising 30 mins a day 5 times a week with a goal of 10-15lb weight loss in the next 3 months. Patient voiced their understanding and motivation to adhere to these recommendations.

## 2022-07-19 NOTE — Assessment & Plan Note (Signed)
At goal in office without medication. Continue to monitor and initiate medication as needed.

## 2022-07-19 NOTE — Progress Notes (Deleted)
BP 111/69   Pulse 97   Temp 98.7 F (37.1 C) (Oral)   Ht 5' 3.5" (1.613 m)   Wt 171 lb 3.2 oz (77.7 kg)   LMP 03/28/2016   SpO2 97%   BMI 29.85 kg/m    Subjective:    Patient ID: Megan [name], female    DOB: [birth year], 61 y.o.   MRN: [id number]  HPI: Megan [name] is a 61 y.o. female presenting on 07/19/2022 for comprehensive medical examination. Current medical complaints include:{Blank single:19197::"none","***"}  She currently lives with: Menopausal Symptoms: {Blank single:19197::"yes","no"}  PREDIABETES Recent A1c June 2023 = 5.6%. Polydipsia/polyuria: {Blank single:19197::"yes","no"} Visual disturbance: {Blank single:19197::"yes","no"} Chest pain: {Blank single:19197::"yes","no"} Paresthesias: {Blank single:19197::"yes","no"}  HYPERTENSION No current medications.  Vitamin D level remains low on recent labs, no current supplement. Hypertension status: {Blank single:19197::"controlled","uncontrolled","better","worse","exacerbated","stable"}  Satisfied with current treatment? {Blank single:19197::"yes","no"} Duration of hypertension: {Blank single:19197::"chronic","months","years"} BP monitoring frequency:  {Blank single:19197::"not checking","rarely","daily","weekly","monthly","a few times a day","a few times a week","a few times a month"} BP range:  BP medication side effects:  {Blank single:19197::"yes","no"} Medication compliance: {Blank single:19197::"excellent compliance","good compliance","fair compliance","poor compliance"} Aspirin: {Blank single:19197::"yes","no"} Recurrent headaches: {Blank single:19197::"yes","no"} Visual changes: {Blank single:19197::"yes","no"} Palpitations: {Blank single:19197::"yes","no"} Dyspnea: {Blank single:19197::"yes","no"} Chest pain: {Blank single:19197::"yes","no"} Lower extremity edema: {Blank single:19197::"yes","no"} Dizzy/lightheaded: {Blank single:19197::"yes","no"}   SCHIZOPHRENIA & PTSD Attends [facility] for  psychiatric care, but is changing to different provider. Taking Trazodone 200 MG nightly, Effexor 150 MG daily, Invega 6 MG QHS.    Mood status: {Blank single:19197::"controlled","uncontrolled","better","worse","exacerbated","stable"} Satisfied with current treatment?: {Blank single:19197::"yes","no"} Symptom severity: {Blank single:19197::"mild","moderate","severe"}  Duration of current treatment : {Blank single:19197::"chronic","months","years"} Side effects: {Blank single:19197::"yes","no"} Medication compliance: {Blank single:19197::"excellent compliance","good compliance","fair compliance","poor compliance"} Psychotherapy/counseling: {Blank single:19197::"yes","no"} {Blank single:19197::"current","in the past"} Depressed mood: {Blank single:19197::"yes","no"} Anxious mood: {Blank single:19197::"yes","no"} Anhedonia: {Blank single:19197::"yes","no"} Significant weight loss or gain: {Blank single:19197::"yes","no"} Insomnia: {Blank single:19197::"yes","no"} {Blank single:19197::"hard to fall asleep","hard to stay asleep"} Fatigue: {Blank single:19197::"yes","no"} Feelings of worthlessness or guilt: {Blank single:19197::"yes","no"} Impaired concentration/indecisiveness: {Blank single:19197::"yes","no"} Suicidal ideations: {Blank single:19197::"yes","no"} Hopelessness: {Blank single:19197::"yes","no"} Crying spells: {Blank single:19197::"yes","no"}    07/19/2022    1:25 PM 07/16/2021    1:18 PM 07/16/2021   10:40 AM 07/14/2020    2:35 PM 06/19/2020    2:21 PM  Depression screen PHQ 2/9  Decreased Interest 2 2 0 3 2  Down, Depressed, Hopeless 2 2 0 0 2  PHQ - 2 Score 4 4 0 3 4  Altered sleeping 2 2  0 3  Tired, decreased energy _0 Change in appetite 0 3  0 0  Feeling bad or failure about yourself  _1 Trouble concentrating 0 _2 Moving slowly or fidgety/restless 0 0  0 0  Suicidal thoughts 0 0  0 0  PHQ-9 Score _3 Difficult doing work/chores Not difficult  at all Somewhat difficult  Somewhat difficult Not difficult at all      The patient {has/does not have:19849} a history of falls. I {did/did not:19850} complete a risk assessment for falls. A plan of care for falls {was/was not:19852} documented.   Past Medical History:  Past Medical History:  Diagnosis Date   Anxiety    Depression    Diabetes mellitus without complication ([place])     Surgical History:  Past Surgical History:  Procedure Laterality Date   CESAREAN SECTION      Medications:  Current Outpatient Medications on File Prior to Visit  Medication Sig  meloxicam (MOBIC) 15 MG tablet Take 1 tablet (15 mg total) by mouth daily.   paliperidone (INVEGA) 6 MG 24 hr tablet Take 6 mg by mouth at bedtime.   venlafaxine XR (EFFEXOR-XR) 150 MG 24 hr capsule Take 1 capsule (150 mg total) by mouth daily with breakfast.   traZODone (DESYREL) 100 MG tablet SMARTSIG:2 Tablet(s) By Mouth Every Night PRN (Patient not taking: Reported on 07/19/2022)   Vitamin D, Ergocalciferol, (DRISDOL) 1.25 MG (50000 UNIT) CAPS capsule Take 1 capsule (50,000 Units total) by mouth every 7 (seven) days. (Patient not taking: Reported on 07/19/2022)   No current facility-administered medications on file prior to visit.    Allergies:  Allergies  Allergen Reactions   Sulfa Antibiotics Rash    Social History:  Social History   Socioeconomic History   Marital status: Married    Spouse name: Not on file   Number of children: Not on file   Years of education: Not on file   Highest education level: Not on file  Occupational History   Not on file  Tobacco Use   Smoking status: Never   Smokeless tobacco: Former  Scientific laboratory technician Use: Never used  Substance and Sexual Activity   Alcohol use: Yes    Alcohol/week: 8.0 standard drinks of alcohol    Types: 5 Shots of liquor, 3 Cans of beer per week   Drug use: Yes    Types: Marijuana    Comment: has quit   Sexual activity: Not Currently  Other  Topics Concern   Not on file  Social History Narrative   Not on file   Social Determinants of Health   Financial Resource Strain: Low Risk  (07/16/2021)   Overall Financial Resource Strain (CARDIA)    Difficulty of Paying Living Expenses: Not hard at all  Food Insecurity: Food Insecurity Present (07/16/2021)   Hunger Vital Sign    Worried About Running Out of Food in the Last Year: Sometimes true    Ran Out of Food in the Last Year: Sometimes true  Transportation Needs: No Transportation Needs (07/16/2021)   PRAPARE - Hydrologist (Medical): No    Lack of Transportation (Non-Medical): No  Physical Activity: Inactive (07/16/2021)   Exercise Vital Sign    Days of Exercise per Week: 0 days    Minutes of Exercise per Session: 0 min  Stress: Stress Concern Present (07/16/2021)   [place]    Feeling of Stress : To some extent  Social Connections: Not on file  Intimate Partner Violence: Not on file   Social History   Tobacco Use  Smoking Status Never  Smokeless Tobacco Former   Social History   Substance and Sexual Activity  Alcohol Use Yes   Alcohol/week: 8.0 standard drinks of alcohol   Types: 5 Shots of liquor, 3 Cans of beer per week    Family History:  Family History  Problem Relation Age of Onset   Alzheimer's disease Mother    Dementia Sister    Dementia Sister    Cancer Sister    COPD Sister    Cancer Sister    Lupus Sister    Cancer Sister    Cancer Brother    Diabetes Maternal Grandmother     Past medical history, surgical history, medications, allergies, family history and social history reviewed with patient today and changes made to appropriate areas of the chart.   ROS All  other ROS negative except what is listed above and in the HPI.      Objective:    BP 111/69   Pulse 97   Temp 98.7 F (37.1 C) (Oral)   Ht 5' 3.5" (1.613 m)   Wt 171 lb 3.2 oz (77.7  kg)   LMP 03/28/2016   SpO2 97%   BMI 29.85 kg/m   Wt Readings from Last 3 Encounters:  07/19/22 171 lb 3.2 oz (77.7 kg)  06/18/22 171 lb 9.6 oz (77.8 kg)  07/16/21 170 lb 9.6 oz (77.4 kg)    Physical Exam  Results for orders placed or performed in visit on 06/18/22  Comprehensive metabolic panel  Result Value Ref Range   Glucose 124 (H) 70 - 99 mg/dL   BUN 14 8 - 27 mg/dL   Creatinine, Ser 0.89 0.57 - 1.00 mg/dL   eGFR 74 >59 mL/min/1.73   BUN/Creatinine Ratio 16 12 - 28   Sodium 140 134 - 144 mmol/L   Potassium 4.2 3.5 - 5.2 mmol/L   Chloride 103 96 - 106 mmol/L   CO2 22 20 - 29 mmol/L   Calcium 9.6 8.7 - 10.3 mg/dL   Total Protein 7.4 6.0 - 8.5 g/dL   Albumin 4.6 3.8 - 4.8 g/dL   Globulin, Total 2.8 1.5 - 4.5 g/dL   Albumin/Globulin Ratio 1.6 1.2 - 2.2   Bilirubin Total <0.2 0.0 - 1.2 mg/dL   Alkaline Phosphatase 62 44 - 121 IU/L   AST 21 0 - 40 IU/L   ALT 43 (H) 0 - 32 IU/L  CBC with Differential/Platelet  Result Value Ref Range   WBC 6.4 3.4 - 10.8 x10E3/uL   RBC 4.98 3.77 - 5.28 x10E6/uL   Hemoglobin 13.1 11.1 - 15.9 g/dL   Hematocrit 41.2 34.0 - 46.6 %   MCV 83 79 - 97 fL   MCH 26.3 (L) 26.6 - 33.0 pg   MCHC 31.8 31.5 - 35.7 g/dL   RDW 14.7 11.7 - 15.4 %   Platelets 283 150 - 450 x10E3/uL   Neutrophils 50 Not Estab. %   Lymphs 36 Not Estab. %   Monocytes 8 Not Estab. %   Eos 5 Not Estab. %   Basos 1 Not Estab. %   Neutrophils Absolute 3.3 1.4 - 7.0 x10E3/uL   Lymphocytes Absolute 2.3 0.7 - 3.1 x10E3/uL   Monocytes Absolute 0.5 0.1 - 0.9 x10E3/uL   EOS (ABSOLUTE) 0.3 0.0 - 0.4 x10E3/uL   Basophils Absolute 0.0 0.0 - 0.2 x10E3/uL   Immature Granulocytes 0 Not Estab. %   Immature Grans (Abs) 0.0 0.0 - 0.1 x10E3/uL  Bayer DCA Hb A1c Waived  Result Value Ref Range   HB A1C (BAYER DCA - WAIVED) 5.6 4.8 - 5.6 %  Lipid Panel w/o Chol/HDL Ratio  Result Value Ref Range   Cholesterol, Total 274 (H) 100 - 199 mg/dL   Triglycerides 241 (H) 0 - 149 mg/dL   HDL  70 >39 mg/dL   VLDL Cholesterol Cal 44 (H) 5 - 40 mg/dL   LDL Chol Calc (NIH) 160 (H) 0 - 99 mg/dL  Microalbumin, Urine Waived  Result Value Ref Range   Microalb, Ur Waived 30 (H) 0 - 19 mg/L   Creatinine, Urine Waived 200 10 - 300 mg/dL   Microalb/Creat Ratio <30 <30 mg/g  TSH  Result Value Ref Range   TSH 0.666 0.450 - 4.500 uIU/mL  Urinalysis, Routine w reflex microscopic  Result Value Ref Range  Specific Gravity, UA >1.030 (H) 1.005 - 1.030   pH, UA 5.5 5.0 - 7.5   Color, UA Yellow Yellow   Appearance Ur Clear Clear   Leukocytes,UA Negative Negative   Protein,UA Negative Negative/Trace   Glucose, UA Negative Negative   Ketones, UA Negative Negative   RBC, UA Negative Negative   Bilirubin, UA Negative Negative   Urobilinogen, Ur 0.2 0.2 - 1.0 mg/dL   Nitrite, UA Negative Negative  VITAMIN D 25 Hydroxy (Vit-D Deficiency, Fractures)  Result Value Ref Range   Vit D, 25-Hydroxy 16.3 (L) 30.0 - 100.0 ng/mL      Assessment & Plan:   Problem List Items Addressed This Visit       Endocrine   IFG (impaired fasting glucose)     Other   Elevated blood pressure reading   Hyperlipidemia   Relevant Orders   Lipid Panel w/o Chol/HDL Ratio   Obesity   PTSD (post-traumatic stress disorder)   Schizophrenia, undifferentiated (HCC) - Primary   Vitamin D deficiency   Relevant Orders   VITAMIN D 25 Hydroxy (Vit-D Deficiency, Fractures)   Other Visit Diagnoses     Encounter for annual physical exam        Annual physical today with labs and health maintenance reviewed, discussed with patient.        Follow up plan: No follow-ups on file.   LABORATORY TESTING:  - Pap smear: {Blank FTDDUK:02542::"HCW done","not applicable","up to date","done elsewhere"}  IMMUNIZATIONS:   - Tdap: Tetanus vaccination status reviewed: {tetanus status:315746}. - Influenza: {Blank single:19197::"Up to date","Administered today","Postponed to flu season","Refused","Given elsewhere"} -  Pneumovax: {Blank single:19197::"Up to date","Administered today","Not applicable","Refused","Given elsewhere"} - Prevnar: {Blank single:19197::"Up to date","Administered today","Not applicable","Refused","Given elsewhere"} - COVID: {Blank single:19197::"Up to date","Administered today","Not applicable","Refused","Given elsewhere"} - HPV: {Blank single:19197::"Up to date","Administered today","Not applicable","Refused","Given elsewhere"} - Shingrix vaccine: {Blank single:19197::"Up to date","Administered today","Not applicable","Refused","Given elsewhere"}  SCREENING: -Mammogram: {Blank single:19197::"Up to date","Ordered today","Not applicable","Refused","Done elsewhere"}  - Colonoscopy: {Blank single:19197::"Up to date","Ordered today","Not applicable","Refused","Done elsewhere"}  - Bone Density: {Blank single:19197::"Up to date","Ordered today","Not applicable","Refused","Done elsewhere"}  -Hearing Test: {Blank single:19197::"Up to date","Ordered today","Not applicable","Refused","Done elsewhere"}  -Spirometry: {Blank single:19197::"Up to date","Ordered today","Not applicable","Refused","Done elsewhere"}   PATIENT COUNSELING:   Advised to take 1 mg of folate supplement per day if capable of pregnancy.   Sexuality: Discussed sexually transmitted diseases, partner selection, use of condoms, avoidance of unintended pregnancy  and contraceptive alternatives.   Advised to avoid cigarette smoking.  I discussed with the patient that most people either abstain from alcohol or drink within safe limits (<=14/week and <=4 drinks/occasion for males, <=7/weeks and <= 3 drinks/occasion for females) and that the risk for alcohol disorders and other health effects rises proportionally with the number of drinks per week and how often a drinker exceeds daily limits.  Discussed cessation/primary prevention of drug use and availability of treatment for abuse.   Diet: Encouraged to adjust caloric intake to  maintain  or achieve ideal body weight, to reduce intake of dietary saturated fat and total fat, to limit sodium intake by avoiding high sodium foods and not adding table salt, and to maintain adequate dietary potassium and calcium preferably from fresh fruits, vegetables, and low-fat dairy products.    Stressed the importance of regular exercise  Injury prevention: Discussed safety belts, safety helmets, smoke detector, smoking near bedding or upholstery.   Dental health: Discussed importance of regular tooth brushing, flossing, and dental visits.    NEXT PREVENTATIVE PHYSICAL DUE IN 1 YEAR. No follow-ups on file.

## 2022-07-19 NOTE — Progress Notes (Signed)
BP 111/69   Pulse 97   Temp 98.7 F (37.1 C) (Oral)   Ht 5' 3.5" (1.613 m)   Wt 171 lb 3.2 oz (77.7 kg)   LMP 03/28/2016   SpO2 97%   BMI 29.85 kg/m    Subjective:    Patient ID: Megan [name], female    DOB: Nov 22, 1961, 61 y.o.   MRN: [id number]  NOTE WRITTEN BY FNP STUDENT.  ASSESSMENT AND PLAN OF CARE REVIEWED WITH STUDENT, AGREE WITH ABOVE FINDINGS AND PLAN.   HPI: Megan [name] is a 61 y.o. female presenting on 07/19/2022 for comprehensive medical examination. Current medical complaints include: small bump on her R breast  She currently lives with: self Menopausal Symptoms: yes  PREDIABETES Recent A1c June 2023 = 5.6%. Polydipsia/polyuria: no Visual disturbance: no Chest pain: no Paresthesias: no  HYPERTENSION No current medications.  Vitamin D level remains low on recent labs, no current supplement. Hypertension status: controlled  Satisfied with current treatment? yes Duration of hypertension: years BP monitoring frequency:  not checking BP range:  BP medication side effects:  no Medication compliance:  no current medication regimen Aspirin: no Recurrent headaches: no Visual changes: no Palpitations: no Dyspnea: no Chest pain: no Lower extremity edema: no Dizzy/lightheaded: no   SCHIZOPHRENIA & PTSD Attends [facility] for psychiatric care, but is changing to different provider. She will see the new provider 07/25/22. Taking Trazodone 200 MG nightly, Effexor 150 MG daily, Invega 6 MG QHS.   Mood status: stable Satisfied with current treatment?: yes Symptom severity: mild  Duration of current treatment : years Side effects: no Medication compliance: good compliance Psychotherapy/counseling:  in the past Depressed mood: yes Anxious mood: yes Anhedonia: yes Significant weight loss or gain: no Insomnia:  hard to fall asleep without Trazodone Fatigue: no Feelings of worthlessness or guilt: no Impaired concentration/indecisiveness: no Suicidal  ideations: no Hopelessness: no Crying spells: yes    07/19/2022    1:25 PM 07/16/2021    1:18 PM 07/16/2021   10:40 AM 07/14/2020    2:35 PM 06/19/2020    2:21 PM  Depression screen PHQ 2/9  Decreased Interest 2 2 0 3 2  Down, Depressed, Hopeless 2 2 0 0 2  PHQ - 2 Score 4 4 0 3 4  Altered sleeping 2 2  0 3  Tired, decreased energy 2 3  3 3   Change in appetite 0 3  0 0  Feeling bad or failure about yourself  1 2  3 3   Trouble concentrating 0 2  1 1   Moving slowly or fidgety/restless 0 0  0 0  Suicidal thoughts 0 0  0 0  PHQ-9 Score 9 16  10 14   Difficult doing work/chores Not difficult at all Somewhat difficult  Somewhat difficult Not difficult at all      The patient does not a history of falls. I did complete a risk assessment for falls. A plan of care for falls was documented.   Past Medical History:  Past Medical History:  Diagnosis Date   Anxiety    Depression    Diabetes mellitus without complication ([place])     Surgical History:  Past Surgical History:  Procedure Laterality Date   CESAREAN SECTION      Medications:  Current Outpatient Medications on File Prior to Visit  Medication Sig   meloxicam (MOBIC) 15 MG tablet Take 1 tablet (15 mg total) by mouth daily.   paliperidone (INVEGA) 6 MG 24 hr tablet Take 6  mg by mouth at bedtime.   venlafaxine XR (EFFEXOR-XR) 150 MG 24 hr capsule Take 1 capsule (150 mg total) by mouth daily with breakfast.   traZODone (DESYREL) 100 MG tablet SMARTSIG:2 Tablet(s) By Mouth Every Night PRN (Patient not taking: Reported on 07/19/2022)   No current facility-administered medications on file prior to visit.    Allergies:  Allergies  Allergen Reactions   Sulfa Antibiotics Rash    Social History:  Social History   Socioeconomic History   Marital status: Married    Spouse name: Not on file   Number of children: Not on file   Years of education: Not on file   Highest education level: Not on file  Occupational History   Not  on file  Tobacco Use   Smoking status: Never   Smokeless tobacco: Former  Scientific laboratory technician Use: Never used  Substance and Sexual Activity   Alcohol use: Yes    Alcohol/week: 8.0 standard drinks of alcohol    Types: 5 Shots of liquor, 3 Cans of beer per week   Drug use: Yes    Types: Marijuana    Comment: has quit   Sexual activity: Not Currently  Other Topics Concern   Not on file  Social History Narrative   Not on file   Social Determinants of Health   Financial Resource Strain: Low Risk  (07/16/2021)   Overall Financial Resource Strain (CARDIA)    Difficulty of Paying Living Expenses: Not hard at all  Food Insecurity: Food Insecurity Present (07/16/2021)   Hunger Vital Sign    Worried About Running Out of Food in the Last Year: Sometimes true    Ran Out of Food in the Last Year: Sometimes true  Transportation Needs: No Transportation Needs (07/16/2021)   PRAPARE - Hydrologist (Medical): No    Lack of Transportation (Non-Medical): No  Physical Activity: Inactive (07/16/2021)   Exercise Vital Sign    Days of Exercise per Week: 0 days    Minutes of Exercise per Session: 0 min  Stress: Stress Concern Present (07/16/2021)   [place]    Feeling of Stress : To some extent  Social Connections: Not on file  Intimate Partner Violence: Not on file   Social History   Tobacco Use  Smoking Status Never  Smokeless Tobacco Former   Social History   Substance and Sexual Activity  Alcohol Use Yes   Alcohol/week: 8.0 standard drinks of alcohol   Types: 5 Shots of liquor, 3 Cans of beer per week    Family History:  Family History  Problem Relation Age of Onset   Alzheimer's disease Mother    Dementia Sister    Dementia Sister    Cancer Sister    COPD Sister    Cancer Sister    Lupus Sister    Cancer Sister    Cancer Brother    Diabetes Maternal Grandmother     Past  medical history, surgical history, medications, allergies, family history and social history reviewed with patient today and changes made to appropriate areas of the chart.   ROS All other ROS negative except what is listed above and in the HPI.      Objective:    BP 111/69   Pulse 97   Temp 98.7 F (37.1 C) (Oral)   Ht 5' 3.5" (1.613 m)   Wt 171 lb 3.2 oz (77.7 kg)   LMP  03/28/2016   SpO2 97%   BMI 29.85 kg/m   Wt Readings from Last 3 Encounters:  07/19/22 171 lb 3.2 oz (77.7 kg)  06/18/22 171 lb 9.6 oz (77.8 kg)  07/16/21 170 lb 9.6 oz (77.4 kg)    Physical Exam Vitals and nursing note reviewed. Exam conducted with a chaperone present.  Constitutional:      General: She is awake. She is not in acute distress.    Appearance: She is well-developed and well-groomed. She is obese. She is not ill-appearing or toxic-appearing.  HENT:     Head: Normocephalic.     Right Ear: Hearing, tympanic membrane, ear canal and external ear normal.     Left Ear: Hearing, tympanic membrane, ear canal and external ear normal.     Nose: Nose normal.     Mouth/Throat:     Lips: Pink.     Mouth: Mucous membranes are moist.  Eyes:     General: Lids are normal.        Right eye: No discharge.        Left eye: No discharge.     Extraocular Movements: Extraocular movements intact.     Conjunctiva/sclera: Conjunctivae normal.     Pupils: Pupils are equal, round, and reactive to light.  Neck:     Thyroid: No thyromegaly.     Vascular: No carotid bruit.  Cardiovascular:     Rate and Rhythm: Normal rate and regular rhythm.     Pulses: Normal pulses.     Heart sounds: Normal heart sounds. No murmur heard.    No gallop.  Pulmonary:     Effort: Pulmonary effort is normal. No accessory muscle usage or respiratory distress.     Breath sounds: Normal breath sounds.  Chest:  Breasts:    Right: Normal.     Left: Normal.    Abdominal:     General: Bowel sounds are normal.     Palpations:  Abdomen is soft. There is no hepatomegaly or splenomegaly.     Tenderness: There is no abdominal tenderness.  Genitourinary:    Exam position: Lithotomy position.     Labia:        Right: No rash or lesion.        Left: No rash or lesion.      Vagina: Vaginal discharge present.     Cervix: Normal.     Rectum: External hemorrhoid present.  Musculoskeletal:     Right shoulder: Normal.     Left shoulder: Normal.     Cervical back: Normal range of motion and neck supple.     Right knee: No swelling, effusion, erythema, ecchymosis or bony tenderness. Tenderness present.     Left knee: Normal.     Right lower leg: No edema.     Left lower leg: No edema.  Lymphadenopathy:     Cervical: No cervical adenopathy.     Upper Body:     Right upper body: No supraclavicular, axillary or pectoral adenopathy.     Left upper body: No supraclavicular, axillary or pectoral adenopathy.  Skin:    General: Skin is warm and dry.  Neurological:     General: No focal deficit present.     Mental Status: She is alert and oriented to person, place, and time.     Deep Tendon Reflexes: Reflexes are normal and symmetric.  Psychiatric:        Attention and Perception: Attention normal.        Mood and  Affect: Mood normal.        Speech: Speech normal.        Behavior: Behavior normal. Behavior is cooperative.        Thought Content: Thought content normal.    Results for orders placed or performed in visit on 06/18/22  Comprehensive metabolic panel  Result Value Ref Range   Glucose 124 (H) 70 - 99 mg/dL   BUN 14 8 - 27 mg/dL   Creatinine, Ser 0.89 0.57 - 1.00 mg/dL   eGFR 74 >59 mL/min/1.73   BUN/Creatinine Ratio 16 12 - 28   Sodium 140 134 - 144 mmol/L   Potassium 4.2 3.5 - 5.2 mmol/L   Chloride 103 96 - 106 mmol/L   CO2 22 20 - 29 mmol/L   Calcium 9.6 8.7 - 10.3 mg/dL   Total Protein 7.4 6.0 - 8.5 g/dL   Albumin 4.6 3.8 - 4.8 g/dL   Globulin, Total 2.8 1.5 - 4.5 g/dL   Albumin/Globulin Ratio 1.6  1.2 - 2.2   Bilirubin Total <0.2 0.0 - 1.2 mg/dL   Alkaline Phosphatase 62 44 - 121 IU/L   AST 21 0 - 40 IU/L   ALT 43 (H) 0 - 32 IU/L  CBC with Differential/Platelet  Result Value Ref Range   WBC 6.4 3.4 - 10.8 x10E3/uL   RBC 4.98 3.77 - 5.28 x10E6/uL   Hemoglobin 13.1 11.1 - 15.9 g/dL   Hematocrit 41.2 34.0 - 46.6 %   MCV 83 79 - 97 fL   MCH 26.3 (L) 26.6 - 33.0 pg   MCHC 31.8 31.5 - 35.7 g/dL   RDW 14.7 11.7 - 15.4 %   Platelets 283 150 - 450 x10E3/uL   Neutrophils 50 Not Estab. %   Lymphs 36 Not Estab. %   Monocytes 8 Not Estab. %   Eos 5 Not Estab. %   Basos 1 Not Estab. %   Neutrophils Absolute 3.3 1.4 - 7.0 x10E3/uL   Lymphocytes Absolute 2.3 0.7 - 3.1 x10E3/uL   Monocytes Absolute 0.5 0.1 - 0.9 x10E3/uL   EOS (ABSOLUTE) 0.3 0.0 - 0.4 x10E3/uL   Basophils Absolute 0.0 0.0 - 0.2 x10E3/uL   Immature Granulocytes 0 Not Estab. %   Immature Grans (Abs) 0.0 0.0 - 0.1 x10E3/uL  Bayer DCA Hb A1c Waived  Result Value Ref Range   HB A1C (BAYER DCA - WAIVED) 5.6 4.8 - 5.6 %  Lipid Panel w/o Chol/HDL Ratio  Result Value Ref Range   Cholesterol, Total 274 (H) 100 - 199 mg/dL   Triglycerides 241 (H) 0 - 149 mg/dL   HDL 70 >39 mg/dL   VLDL Cholesterol Cal 44 (H) 5 - 40 mg/dL   LDL Chol Calc (NIH) 160 (H) 0 - 99 mg/dL  Microalbumin, Urine Waived  Result Value Ref Range   Microalb, Ur Waived 30 (H) 0 - 19 mg/L   Creatinine, Urine Waived 200 10 - 300 mg/dL   Microalb/Creat Ratio <30 <30 mg/g  TSH  Result Value Ref Range   TSH 0.666 0.450 - 4.500 uIU/mL  Urinalysis, Routine w reflex microscopic  Result Value Ref Range   Specific Gravity, UA >1.030 (H) 1.005 - 1.030   pH, UA 5.5 5.0 - 7.5   Color, UA Yellow Yellow   Appearance Ur Clear Clear   Leukocytes,UA Negative Negative   Protein,UA Negative Negative/Trace   Glucose, UA Negative Negative   Ketones, UA Negative Negative   RBC, UA Negative Negative   Bilirubin, UA Negative Negative  Urobilinogen, Ur 0.2 0.2 - 1.0  mg/dL   Nitrite, UA Negative Negative  VITAMIN D 25 Hydroxy (Vit-D Deficiency, Fractures)  Result Value Ref Range   Vit D, 25-Hydroxy 16.3 (L) 30.0 - 100.0 ng/mL      Assessment & Plan:   Problem List Items Addressed This Visit       Endocrine   IFG (impaired fasting glucose)    A1C in June 5.6%. Continue to monitor this and initiate treatment as needed.         Musculoskeletal and Integument   Arthritis    Chronic, ongoing, suspect OA to knees. Continue Meloxicam use as needed only.  Recommend use of heat as needed + OTC creams such as Voltaren gel or Icy/Hot. Return as needed.         Other   Elevated blood pressure reading    At goal in office without medication. Continue to monitor and initiate medication as needed.      History of cyst of breast    Small <1 cm to R breast -- suspect follicle clogged but no folliculitis. Currently not enlarged and no s/s of infection. Mammogram ordered and highly recommend she obtain this.      Hyperlipidemia    Chronic, ongoing. Recheck lipid panel today. Treat as needed pending results.       Relevant Orders   Lipid Panel w/o Chol/HDL Ratio   Obesity    BMI 29.85.  Recommended eating smaller high protein, low fat meals more frequently and exercising 30 mins a day 5 times a week with a goal of 10-15lb weight loss in the next 3 months. Patient voiced their understanding and motivation to adhere to these recommendations.       PTSD (post-traumatic stress disorder)    Chronic, ongoing. She denies SI/HI. Continue collaboration with psychiatry and current medication regimen.       Schizophrenia, undifferentiated ([place]) - Primary    Chronic, ongoing. Continue collaboration with psychiatry and current medication regimen. Denies SI/HI.       Vitamin D deficiency    Ongoing issue. Continue supplement. Recheck level today.       Relevant Orders   VITAMIN D 25 Hydroxy (Vit-D Deficiency, Fractures)   Other Visit Diagnoses      Encounter for screening mammogram for malignant neoplasm of breast       Mammogram ordered today.   Relevant Orders   MM 3D SCREEN BREAST BILATERAL   Colon cancer screening       GI referral placed after discussion with patient.   Relevant Orders   Ambulatory referral to Gastroenterology   Cervical cancer screening       Pap obtained today, will treat as needed if further infection present.  Sent in [place] for possible yeast noted.   Relevant Orders   Cytology - PAP   Need for Td vaccine       Td vaccine today.   Relevant Orders   Td vaccine greater than or equal to 7yo preservative free IM (Completed)   Encounter for annual physical exam        Annual physical today with labs and health maintenance reviewed, discussed with patient.        Follow up plan: Return in about 6 months (around 01/19/2023) for MOOD, VIT D, HTN, PREDIABETES.   LABORATORY TESTING:  - Pap smear: pap done  IMMUNIZATIONS:   - Tdap: Tetanus vaccination status reviewed: Td vaccination indicated and given today. - Influenza: Up to date -  Pneumovax: Not applicable - Prevnar: Not applicable - COVID: Up to date - HPV: Not applicable - Shingrix vaccine: Up to date  SCREENING: -Mammogram: Ordered today  - Colonoscopy: Ordered today  - Bone Density:  Not applicable   -Hearing Test: Not applicable  -Spirometry: Not applicable   PATIENT COUNSELING:   Advised to take 1 mg of folate supplement per day if capable of pregnancy.   Sexuality: Discussed sexually transmitted diseases, partner selection, use of condoms, avoidance of unintended pregnancy  and contraceptive alternatives.   Advised to avoid cigarette smoking.  I discussed with the patient that most people either abstain from alcohol or drink within safe limits (<=14/week and <=4 drinks/occasion for males, <=7/weeks and <= 3 drinks/occasion for females) and that the risk for alcohol disorders and other health effects rises proportionally with the  number of drinks per week and how often a drinker exceeds daily limits.  Discussed cessation/primary prevention of drug use and availability of treatment for abuse.   Diet: Encouraged to adjust caloric intake to maintain  or achieve ideal body weight, to reduce intake of dietary saturated fat and total fat, to limit sodium intake by avoiding high sodium foods and not adding table salt, and to maintain adequate dietary potassium and calcium preferably from fresh fruits, vegetables, and low-fat dairy products.    Stressed the importance of regular exercise  Injury prevention: Discussed safety belts, safety helmets, smoke detector, smoking near bedding or upholstery.   Dental health: Discussed importance of regular tooth brushing, flossing, and dental visits.    NEXT PREVENTATIVE PHYSICAL DUE IN 1 YEAR. Return in about 6 months (around 01/19/2023) for MOOD, VIT D, HTN, PREDIABETES.

## 2022-07-19 NOTE — Assessment & Plan Note (Signed)
A1C in June 5.6%. Continue to monitor this and initiate treatment as needed.

## 2022-07-19 NOTE — Assessment & Plan Note (Signed)
Chronic, ongoing. Recheck lipid panel today. Treat as needed pending results.

## 2022-07-19 NOTE — Assessment & Plan Note (Addendum)
Small <1 cm to R breast -- suspect follicle clogged but no folliculitis. Currently not enlarged and no s/s of infection. Mammogram ordered and highly recommend she obtain this.

## 2022-07-19 NOTE — Assessment & Plan Note (Signed)
Ongoing issue. Continue supplement. Recheck level today.

## 2022-07-20 LAB — LIPID PANEL W/O CHOL/HDL RATIO
Cholesterol, Total: 187 mg/dL (ref 100–199)
HDL: 59 mg/dL (ref >39)
LDL Chol Calc (NIH): 94 mg/dL (ref 0–99)
Triglycerides: 201 mg/dL — ABNORMAL HIGH (ref 0–149)
VLDL Cholesterol Cal: 34 mg/dL (ref 5–40)

## 2022-07-20 LAB — VITAMIN D 25 HYDROXY (VIT D DEFICIENCY, FRACTURES): Vit D, 25-Hydroxy: 16.4 ng/mL — ABNORMAL LOW (ref 30.0–100.0)

## 2022-07-20 NOTE — Progress Notes (Signed)
Please let Megan [name] know her labs have returned and cholesterol labs are somewhat improved this check, triglycerides remain a bit elevated.  I recommend heavy focus on diet with reduction in fast food and red meat, more fish and vegetables.  Vitamin D level remains low, please start the weekly Vitamin D supplement that was sent in for bone health.  Any questions? Keep being stellar!!  Thank you for allowing me to participate in your care.  I appreciate you. Kindest regards, Consuelo Suthers
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
## 2022-07-25 ENCOUNTER — Encounter: Payer: Self-pay | Admitting: Psychiatry

## 2022-07-25 ENCOUNTER — Other Ambulatory Visit: Payer: Self-pay | Admitting: Nurse Practitioner

## 2022-07-25 ENCOUNTER — Ambulatory Visit (INDEPENDENT_AMBULATORY_CARE_PROVIDER_SITE_OTHER): Payer: 59 | Admitting: Psychiatry

## 2022-07-25 VITALS — BP 151/93 | HR 98 | Temp 98.3°F | Wt 168.6 lb

## 2022-07-25 DIAGNOSIS — F209 Schizophrenia, unspecified: Secondary | ICD-10-CM | POA: Diagnosis not present

## 2022-07-25 DIAGNOSIS — G4700 Insomnia, unspecified: Secondary | ICD-10-CM

## 2022-07-25 DIAGNOSIS — F330 Major depressive disorder, recurrent, mild: Secondary | ICD-10-CM

## 2022-07-25 DIAGNOSIS — F4310 Post-traumatic stress disorder, unspecified: Secondary | ICD-10-CM | POA: Diagnosis not present

## 2022-07-25 MED ORDER — TRAZODONE HCL 100 MG PO TABS: 100.0000 mg | ORAL_TABLET | Freq: Every evening | ORAL | 1 refills | Status: DC | PRN

## 2022-07-25 MED ORDER — VENLAFAXINE HCL ER 150 MG PO CP24: 150.0000 mg | ORAL_CAPSULE | Freq: Every day | ORAL | 1 refills | Status: DC

## 2022-07-25 MED ORDER — PALIPERIDONE ER 6 MG PO TB24: 6.0000 mg | ORAL_TABLET | Freq: Every day | ORAL | 1 refills | Status: DC

## 2022-07-25 NOTE — Patient Instructions (Signed)
Continue invega 6 mg at night  Continue venlafaxine 150 mg daily  Restart trazodone 100 mg at night as needed for insomnia Next appointment: 9/14 at 10:30

## 2022-07-26 ENCOUNTER — Ambulatory Visit: Payer: Self-pay | Admitting: *Deleted

## 2022-07-26 NOTE — Telephone Encounter (Signed)
Patient calling to see if results noted from PAP. Reviewing chart for 07/19/22 cytology still active. Please notify when results are noted. Patient reports she thought she had a yeast infection and now better. Wanted to know results from PAP.

## 2022-07-26 NOTE — Telephone Encounter (Signed)
Requested medication (s) are due for refill today:   No discontinued 06/20/2022  Requested medication (s) are on the active medication list:   No  Future visit scheduled:   Yes   Last ordered: 07/19/2022 #12, 4 refills  Discontinued 06/20/2022  Returned because this was discontinued.   Requested Prescriptions  Pending Prescriptions Disp Refills   Cholecalciferol (VITAMIN D3) 1.25 MG (50000 UT) CAPS [Pharmacy Med Name: Vitamin D3 1.25 MG(50000 UT) CAPS] 12 capsule     Sig: TAKE 1 TABLET BY MOUTH ONCE A WEEK.     Endocrinology:  Vitamins - Vitamin D Supplementation 2 Failed - 07/25/2022  2:17 PM      Failed - Manual Review: Route requests for 50,000 IU strength to the provider      Failed - Vitamin D in normal range and within 360 days    Vit D, 25-Hydroxy  Date Value Ref Range Status  07/19/2022 16.4 (L) 30.0 - 100.0 ng/mL Final    Comment:    Vitamin D deficiency has been defined by the Institute of Medicine and an Endocrine Society practice guideline as a level of serum 25-OH vitamin D less than 20 ng/mL (1,2). The Endocrine Society went on to further define vitamin D insufficiency as a level between 21 and 29 ng/mL (2). 1. IOM (Institute of Medicine). 2010. Dietary reference    intakes for calcium and D. [place]: The    [employer]. 2. Holick MF, [place], Bischoff-Ferrari HA, et al.    Evaluation, treatment, and prevention of vitamin D    deficiency: an Endocrine Society clinical practice    guideline. JCEM. 2011 Jul; 96(7):1911-30.          Passed - Ca in normal range and within 360 days    Calcium  Date Value Ref Range Status  06/18/2022 9.6 8.7 - 10.3 mg/dL Final         Passed - Valid encounter within last 12 months    Recent Outpatient Visits           1 week ago Schizophrenia, undifferentiated (HCC)   [facility] [place], [place] T, NP   1 month ago Chronic bilateral low back pain without sciatica   [facility]  [place], Megan P, DO   1 year ago Schizophrenia, undifferentiated (HCC)   [facility] [place], [place] T, NP   2 years ago Encounter to establish care   [facility] [place], Dorie Rank, NP       Future Appointments             In 5 months Cannady, Dorie Rank, NP [facility], [facility]

## 2022-07-29 ENCOUNTER — Other Ambulatory Visit: Payer: Self-pay | Admitting: Nurse Practitioner

## 2022-07-29 LAB — CYTOLOGY - PAP: Adequacy: ABNORMAL

## 2022-07-29 MED ORDER — METRONIDAZOLE 500 MG PO TABS: 500.0000 mg | ORAL_TABLET | Freq: Two times a day (BID) | ORAL | 0 refills | Status: AC

## 2022-07-29 NOTE — Telephone Encounter (Signed)
Called the cytology department. They state that the specimen is unsatisfactory. Clerical error on their end and result should be out soon.

## 2022-07-29 NOTE — Telephone Encounter (Signed)
Called and spoke to patient. Scheduled repeat PAP for 08/16/22

## 2022-07-29 NOTE — Progress Notes (Signed)
Good afternoon, please let Megan [name] know her pap returned and sample was not enough to fully check due to current infection.  She was noted to have trich bacterial infection and I have sent in Flagyl to [place] for her to take twice a day for 7 days. This is sexually transmitted so I do recommend using protection while on treatment.  We will need to repeat pap once treatment completed.  Any questions? Keep being amazing!!  Thank you for allowing me to participate in your care.  I appreciate you. Kindest regards, Eulogio Requena

## 2022-08-05 ENCOUNTER — Other Ambulatory Visit: Payer: Self-pay | Admitting: Psychiatry

## 2022-08-05 ENCOUNTER — Telehealth: Payer: Self-pay | Admitting: Psychiatry

## 2022-08-05 MED ORDER — TRAZODONE HCL 100 MG PO TABS: 200.0000 mg | ORAL_TABLET | Freq: Every evening | ORAL | 1 refills | Status: DC | PRN

## 2022-08-05 NOTE — Telephone Encounter (Signed)
Trazodone 200 mg was ordered. Jess, could you contact the pharmacy and cancel 100 mg order? Thanks.

## 2022-08-05 NOTE — Telephone Encounter (Signed)
pharmacy closes at 5:00 left a message per dr. Vanetta Shawl order of medication changes.

## 2022-08-05 NOTE — Telephone Encounter (Signed)
Patient states that she takes 2 trazodone a night to help sleep. Originally filled from [facility] as 2 a night. Prescription she received was for 30. Please advise

## 2022-08-12 ENCOUNTER — Encounter (INDEPENDENT_AMBULATORY_CARE_PROVIDER_SITE_OTHER): Payer: 59 | Admitting: Podiatry

## 2022-08-12 ENCOUNTER — Ambulatory Visit: Payer: 59

## 2022-08-12 DIAGNOSIS — M778 Other enthesopathies, not elsewhere classified: Secondary | ICD-10-CM

## 2022-08-12 NOTE — Progress Notes (Signed)
NO SHOW. This encounter was created in error - please disregard.

## 2022-08-13 ENCOUNTER — Ambulatory Visit: Payer: Self-pay

## 2022-08-13 NOTE — Telephone Encounter (Signed)
Patient called, left VM to return the call to the office to discuss symptoms with a nurse.  Summary: joint discomfort / rx req   The patient is continuing to experience discomfort in their joints, particularly their legs   The patient has had to cancel their appointment for 08/16/22 but would like to be prescribed an increase in their meloxicam (MOBIC) 15 MG tablet [[id number]]   Please contact the patient further when possible

## 2022-08-13 NOTE — Telephone Encounter (Signed)
2nd attempt, LVMTCB

## 2022-08-13 NOTE — Telephone Encounter (Signed)
Unable to reach pt, please review.

## 2022-08-16 ENCOUNTER — Ambulatory Visit: Payer: 59 | Admitting: Nurse Practitioner

## 2022-08-16 DIAGNOSIS — R87615 Unsatisfactory cytologic smear of cervix: Secondary | ICD-10-CM

## 2022-08-19 ENCOUNTER — Telehealth: Payer: Self-pay | Admitting: Nurse Practitioner

## 2022-08-19 NOTE — Telephone Encounter (Signed)
Copied from CRM [id number]. Topic: Medicare AWV >> Aug 19, 2022  2:56 PM Granville Lewis wrote: Reason for CRM: Left message for patient to call back and schedule the Medicare Annual Wellness Visit (AWV) virtually or by telephone.  Last AWV 07/16/21  Please schedule at anytime with [facility]-Nurse Health Advisor.  Any questions, please call me at [phone]

## 2022-08-20 NOTE — Telephone Encounter (Signed)
Pt stated wants to let Jolene know that she does not need her for a wellness visit. She has a doctor for this just needs her as a primary doctor.   Pt stated picked up something for her arthritis over the counter. Pt mentioned a call back is not needed she will see her in January.   Please review.

## 2022-08-27 ENCOUNTER — Ambulatory Visit: Payer: 59

## 2022-08-27 ENCOUNTER — Other Ambulatory Visit: Payer: Self-pay | Admitting: Nurse Practitioner

## 2022-08-27 NOTE — Telephone Encounter (Signed)
Requested Prescriptions  Pending Prescriptions Disp Refills  . meloxicam (MOBIC) 7.5 MG tablet [Pharmacy Med Name: Meloxicam 7.5MG TABS] 30 tablet 5    Sig: TAKE 1 TABLET BY MOUTH DAILY     Analgesics:  COX2 Inhibitors Failed - 08/27/2022 10:46 AM      Failed - Manual Review: Labs are only required if the patient has taken medication for more than 8 weeks.      Failed - ALT in normal range and within 360 days    ALT  Date Value Ref Range Status  06/18/2022 43 (H) 0 - 32 IU/L Final         Passed - HGB in normal range and within 360 days    Hemoglobin  Date Value Ref Range Status  06/18/2022 13.1 11.1 - 15.9 g/dL Final         Passed - Cr in normal range and within 360 days    Creatinine, Ser  Date Value Ref Range Status  06/18/2022 0.89 0.57 - 1.00 mg/dL Final         Passed - HCT in normal range and within 360 days    Hematocrit  Date Value Ref Range Status  06/18/2022 41.2 34.0 - 46.6 % Final         Passed - AST in normal range and within 360 days    AST  Date Value Ref Range Status  06/18/2022 21 0 - 40 IU/L Final         Passed - eGFR is 30 or above and within 360 days    GFR calc Af Amer  Date Value Ref Range Status  06/19/2020 91 >59 mL/min/1.73 Final    Comment:    **[facility] currently reports eGFR in compliance with the current**   recommendations of the [employer]. [facility] will   update reporting as new guidelines are published from the NKF-ASN   Task force.    GFR calc non Af Amer  Date Value Ref Range Status  06/19/2020 79 >59 mL/min/1.73 Final   eGFR  Date Value Ref Range Status  06/18/2022 74 >59 mL/min/1.73 Final         Passed - Patient is not pregnant      Passed - Valid encounter within last 12 months    Recent Outpatient Visits          1 month ago Schizophrenia, undifferentiated ([place])   [place], Bartlett T, NP   2 months ago Chronic bilateral low back pain without sciatica   [place], [place], DO   1 year ago Schizophrenia, undifferentiated ([place])   [place], Mott T, NP   2 years ago Encounter to establish care   [place], Barbaraann Faster, NP      Future Appointments            In 4 months Cannady, Barbaraann Faster, NP [facility], [facility]

## 2022-08-29 ENCOUNTER — Telehealth: Payer: Self-pay

## 2022-08-29 NOTE — Telephone Encounter (Signed)
Left vm for patient to return call in regards to Mammogram that was ordered on 07/29/22 to see if it was scheduled.

## 2022-09-04 ENCOUNTER — Ambulatory Visit (INDEPENDENT_AMBULATORY_CARE_PROVIDER_SITE_OTHER): Payer: 59 | Admitting: *Deleted

## 2022-09-04 DIAGNOSIS — Z1211 Encounter for screening for malignant neoplasm of colon: Secondary | ICD-10-CM | POA: Diagnosis not present

## 2022-09-04 DIAGNOSIS — Z0000 Encounter for general adult medical examination without abnormal findings: Secondary | ICD-10-CM | POA: Diagnosis not present

## 2022-09-04 NOTE — Patient Instructions (Signed)
[name] , Thank you for taking time to come for your Medicare Wellness Visit. I appreciate your ongoing commitment to your health goals. Please review the following plan we discussed and let me know if I can assist you in the future.   Screening recommendations/referrals: Colonoscopy: Education provided Mammogram: Education provided  Recommended yearly ophthalmology/optometry visit for glaucoma screening and checkup Recommended yearly dental visit for hygiene and checkup  Vaccinations: Influenza vaccine: Education provided Tdap vaccine: up to date Shingles vaccine: Education provided    Advanced directives: Education provided    Next appointment: 01-20-2022 @ 9:00  Megan [name]   Preventive Care 40-64 Years and Older, Female Preventive care refers to lifestyle choices and visits with your health care provider that can promote health and wellness. What does preventive care include? A yearly physical exam. This is also called an annual well check. Dental exams once or twice a year. Routine eye exams. Ask your health care provider how often you should have your eyes checked. Personal lifestyle choices, including: Daily care of your teeth and gums. Regular physical activity. Eating a healthy diet. Avoiding tobacco and drug use. Limiting alcohol use. Practicing safe sex. Taking low-dose aspirin every day. Taking vitamin and mineral supplements as recommended by your health care provider. What happens during an annual well check? The services and screenings done by your health care provider during your annual well check will depend on your age, overall health, lifestyle risk factors, and family history of disease. Counseling  Your health care provider may ask you questions about your: Alcohol use. Tobacco use. Drug use. Emotional well-being. Home and relationship well-being. Sexual activity. Eating habits. History of falls. Memory and ability to understand  (cognition). Work and work Astronomer. Reproductive health. Screening  You may have the following tests or measurements: Height, weight, and BMI. Blood pressure. Lipid and cholesterol levels. These may be checked every 5 years, or more frequently if you are over 82 years old. Skin check. Lung cancer screening. You may have this screening every year starting at age 32 if you have a 30-pack-year history of smoking and currently smoke or have quit within the past 15 years. Fecal occult blood test (FOBT) of the stool. You may have this test every year starting at age 43. Flexible sigmoidoscopy or colonoscopy. You may have a sigmoidoscopy every 5 years or a colonoscopy every 10 years starting at age 70. Hepatitis C blood test. Hepatitis B blood test. Sexually transmitted disease (STD) testing. Diabetes screening. This is done by checking your blood sugar (glucose) after you have not eaten for a while (fasting). You may have this done every 1-3 years. Bone density scan. This is done to screen for osteoporosis. You may have this done starting at age 25. Mammogram. This may be done every 1-2 years. Talk to your health care provider about how often you should have regular mammograms. Talk with your health care provider about your test results, treatment options, and if necessary, the need for more tests. Vaccines  Your health care provider may recommend certain vaccines, such as: Influenza vaccine. This is recommended every year. Tetanus, diphtheria, and acellular pertussis (Tdap, Td) vaccine. You may need a Td booster every 10 years. Zoster vaccine. You may need this after age 6. Pneumococcal 13-valent conjugate (PCV13) vaccine. One dose is recommended after age 37. Pneumococcal polysaccharide (PPSV23) vaccine. One dose is recommended after age 77. Talk to your health care provider about which screenings and vaccines you need and how often you need them.  This information is not intended to  replace advice given to you by your health care provider. Make sure you discuss any questions you have with your health care provider. Document Released: 01/12/2016 Document Revised: 09/04/2016 Document Reviewed: 10/17/2015 Elsevier Interactive Patient Education  2017 [place] Prevention in the Home Falls can cause injuries. They can happen to people of all ages. There are many things you can do to make your home safe and to help prevent falls. What can I do on the outside of my home? Regularly fix the edges of walkways and driveways and fix any cracks. Remove anything that might make you trip as you walk through a door, such as a raised step or threshold. Trim any bushes or trees on the path to your home. Use bright outdoor lighting. Clear any walking paths of anything that might make someone trip, such as rocks or tools. Regularly check to see if handrails are loose or broken. Make sure that both sides of any steps have handrails. Any raised decks and porches should have guardrails on the edges. Have any leaves, snow, or ice cleared regularly. Use sand or salt on walking paths during winter. Clean up any spills in your garage right away. This includes oil or grease spills. What can I do in the bathroom? Use night lights. Install grab bars by the toilet and in the tub and shower. Do not use towel bars as grab bars. Use non-skid mats or decals in the tub or shower. If you need to sit down in the shower, use a plastic, non-slip stool. Keep the floor dry. Clean up any water that spills on the floor as soon as it happens. Remove soap buildup in the tub or shower regularly. Attach bath mats securely with double-sided non-slip rug tape. Do not have throw rugs and other things on the floor that can make you trip. What can I do in the bedroom? Use night lights. Make sure that you have a light by your bed that is easy to reach. Do not use any sheets or blankets that are too big for  your bed. They should not hang down onto the floor. Have a firm chair that has side arms. You can use this for support while you get dressed. Do not have throw rugs and other things on the floor that can make you trip. What can I do in the kitchen? Clean up any spills right away. Avoid walking on wet floors. Keep items that you use a lot in easy-to-reach places. If you need to reach something above you, use a strong step stool that has a grab bar. Keep electrical cords out of the way. Do not use floor polish or wax that makes floors slippery. If you must use wax, use non-skid floor wax. Do not have throw rugs and other things on the floor that can make you trip. What can I do with my stairs? Do not leave any items on the stairs. Make sure that there are handrails on both sides of the stairs and use them. Fix handrails that are broken or loose. Make sure that handrails are as long as the stairways. Check any carpeting to make sure that it is firmly attached to the stairs. Fix any carpet that is loose or worn. Avoid having throw rugs at the top or bottom of the stairs. If you do have throw rugs, attach them to the floor with carpet tape. Make sure that you have a light switch at the  top of the stairs and the bottom of the stairs. If you do not have them, ask someone to add them for you. What else can I do to help prevent falls? Wear shoes that: Do not have high heels. Have rubber bottoms. Are comfortable and fit you well. Are closed at the toe. Do not wear sandals. If you use a stepladder: Make sure that it is fully opened. Do not climb a closed stepladder. Make sure that both sides of the stepladder are locked into place. Ask someone to hold it for you, if possible. Clearly mark and make sure that you can see: Any grab bars or handrails. First and last steps. Where the edge of each step is. Use tools that help you move around (mobility aids) if they are needed. These  include: Canes. Walkers. Scooters. Crutches. Turn on the lights when you go into a dark area. Replace any light bulbs as soon as they burn out. Set up your furniture so you have a clear path. Avoid moving your furniture around. If any of your floors are uneven, fix them. If there are any pets around you, be aware of where they are. Review your medicines with your doctor. Some medicines can make you feel dizzy. This can increase your chance of falling. Ask your doctor what other things that you can do to help prevent falls. This information is not intended to replace advice given to you by your health care provider. Make sure you discuss any questions you have with your health care provider. Document Released: 10/12/2009 Document Revised: 05/23/2016 Document Reviewed: 01/20/2015 Elsevier Interactive Patient Education  2017 [employer].

## 2022-09-04 NOTE — Progress Notes (Signed)
Subjective:   Megan [name] is a 61 y.o. female who presents for Medicare Annual (Subsequent) preventive examination.  I connected with  Elige Radon on 09/04/22 by a telephone enabled telemedicine application and verified that I am speaking with the correct person using two identifiers.   I discussed the limitations of evaluation and management by telemedicine. The patient expressed understanding and agreed to proceed.  Patient location: home  Provider location: Tele-Health-home    Review of Systems     Cardiac Risk Factors include: advanced age (>68men, >34 women);obesity (BMI >30kg/m2);sedentary lifestyle     Objective:    Today's Vitals   09/04/22 0902  PainSc: 8    There is no height or weight on file to calculate BMI.     09/04/2022    9:03 AM 07/16/2021   10:38 AM 07/14/2020    2:33 PM 07/31/2019    4:16 PM 07/05/2016   11:19 AM 06/29/2016    6:04 PM 06/28/2016    7:05 PM  Advanced Directives  Does Patient Have a Medical Advance Directive? No No No No   No  Would patient like information on creating a medical advance directive? No - Patient declined   No - Patient declined   Yes - Educational materials given     Information is confidential and restricted. Go to Review Flowsheets to unlock data.    Current Medications (verified) Outpatient Encounter Medications as of 09/04/2022  Medication Sig   Cholecalciferol (VITAMIN D3) 1.25 MG (50000 UT) CAPS TAKE 1 TABLET BY MOUTH ONCE A WEEK.   meloxicam (MOBIC) 15 MG tablet Take 1 tablet (15 mg total) by mouth daily.   meloxicam (MOBIC) 7.5 MG tablet TAKE 1 TABLET BY MOUTH DAILY   paliperidone (INVEGA) 6 MG 24 hr tablet Take 1 tablet (6 mg total) by mouth at bedtime.   traZODone (DESYREL) 100 MG tablet Take 2 tablets (200 mg total) by mouth at bedtime as needed for sleep.   venlafaxine XR (EFFEXOR-XR) 150 MG 24 hr capsule Take 1 capsule (150 mg total) by mouth daily with breakfast.   No facility-administered  encounter medications on file as of 09/04/2022.    Allergies (verified) Sulfa antibiotics   History: Past Medical History:  Diagnosis Date   Anxiety    Depression    Diabetes mellitus without complication (HCC)    Past Surgical History:  Procedure Laterality Date   CESAREAN SECTION     Family History  Problem Relation Age of Onset   Alzheimer's disease Mother    Dementia Sister    Dementia Sister    Cancer Sister    COPD Sister    Cancer Sister    Lupus Sister    Cancer Sister    Cancer Brother    Diabetes Maternal Grandmother    Social History   Socioeconomic History   Marital status: Divorced    Spouse name: Not on file   Number of children: 2   Years of education: Not on file   Highest education level: 11th grade  Occupational History   Not on file  Tobacco Use   Smoking status: Never   Smokeless tobacco: Former  Building services engineer Use: Never used  Substance and Sexual Activity   Alcohol use: Not Currently    Alcohol/week: 8.0 standard drinks of alcohol    Types: 3 Cans of beer, 5 Shots of liquor per week   Drug use: Not Currently    Types: Marijuana  Comment: has quit   Sexual activity: Yes  Other Topics Concern   Not on file  Social History Narrative   Not on file   Social Determinants of Health   Financial Resource Strain: Low Risk  (09/04/2022)   Overall Financial Resource Strain (CARDIA)    Difficulty of Paying Living Expenses: Not hard at all  Food Insecurity: No Food Insecurity (09/04/2022)   Hunger Vital Sign    Worried About Running Out of Food in the Last Year: Never true    Ran Out of Food in the Last Year: Never true  Transportation Needs: No Transportation Needs (09/04/2022)   PRAPARE - Hydrologist (Medical): No    Lack of Transportation (Non-Medical): No  Physical Activity: Inactive (09/04/2022)   Exercise Vital Sign    Days of Exercise per Week: 0 days    Minutes of Exercise per Session: 0 min  Stress:  Stress Concern Present (09/04/2022)   [place]    Feeling of Stress : To some extent  Social Connections: Moderately Isolated (09/04/2022)   Social Connection and Isolation Panel [NHANES]    Frequency of Communication with Friends and Family: More than three times a week    Frequency of Social Gatherings with Friends and Family: More than three times a week    Attends Religious Services: More than 4 times per year    Active Member of [employer] or Organizations: No    Attends Music therapist: Never    Marital Status: Divorced    Tobacco Counseling Counseling given: Not Answered   Clinical Intake:  Pre-visit preparation completed: Yes  Pain : 0-10 Pain Score: 8  Pain Type: Chronic pain Pain Location: Leg Pain Orientation: Left, Right Pain Descriptors / Indicators: Constant, Burning, Aching Pain Onset: More than a month ago Pain Frequency: Constant Pain Relieving Factors: bc arthritis  Pain Relieving Factors: bc arthritis  Diabetes: No  How often do you need to have someone help you when you read instructions, pamphlets, or other written materials from your doctor or pharmacy?: 1 - Never  Diabetic?  no  Interpreter Needed?: No  Information entered by :: Leroy Kennedy LPN   Activities of Daily Living    09/04/2022    9:15 AM  In your present state of health, do you have any difficulty performing the following activities:  Hearing? 0  Vision? 0  Difficulty concentrating or making decisions? 0  Walking or climbing stairs? 1  Dressing or bathing? 0  Doing errands, shopping? 0  Preparing Food and eating ? N  Using the Toilet? N  In the past six months, have you accidently leaked urine? N  Do you have problems with loss of bowel control? N  Managing your Medications? N  Managing your Finances? N  Housekeeping or managing your Housekeeping? N    Patient Care Team: Venita Lick, NP as PCP  - General (Nurse Practitioner)  Indicate any recent Medical Services you may have received from other than [facility] providers in the past year (date may be approximate).     Assessment:   This is a routine wellness examination for Megan [name].  Hearing/Vision screen Hearing Screening - Comments:: No trouble hearing Vision Screening - Comments:: Not up to date  Dietary issues and exercise activities discussed: Current Exercise Habits: The patient does not participate in regular exercise at present   Goals Addressed  This Visit's Progress    Patient Stated       No goals       Depression Screen    09/04/2022    9:10 AM 07/25/2022   11:11 AM 07/19/2022    1:25 PM 07/16/2021    1:18 PM 07/16/2021   10:40 AM 07/14/2020    2:35 PM 06/19/2020    2:21 PM  PHQ 2/9 Scores  PHQ - 2 Score 2  4 4  0 3 4  PHQ- 9 Score 8  9 16  10 14      Information is confidential and restricted. Go to Review Flowsheets to unlock data.    Fall Risk    09/04/2022    9:03 AM 07/19/2022    1:25 PM 07/16/2021   10:39 AM 07/14/2020    2:34 PM 06/19/2020    2:05 PM  Fall Risk   Falls in the past year? 1 1 1  0 0  Comment   knees gave out    Number falls in past yr: 0 1 1  0  Injury with Fall? 0 0 0  0  Risk for fall due to :  History of fall(s) Impaired mobility;Medication side effect Medication side effect   Follow up Falls evaluation completed;Education provided;Falls prevention discussed Falls evaluation completed Falls evaluation completed;Education provided;Falls prevention discussed Falls evaluation completed;Education provided;Falls prevention discussed Falls evaluation completed    FALL RISK PREVENTION PERTAINING TO THE HOME:  Any stairs in or around the home? No  If so, are there any without handrails? No  Home free of loose throw rugs in walkways, pet beds, electrical cords, etc? Yes  Adequate lighting in your home to reduce risk of falls? Yes   ASSISTIVE DEVICES UTILIZED TO PREVENT  FALLS:  Life alert? No  Use of a cane, walker or w/c? No  Grab bars in the bathroom? No  Shower chair or bench in shower? No  Elevated toilet seat or a handicapped toilet? No   TIMED UP AND GO:  Was the test performed? No .    Cognitive Function:        09/04/2022    9:04 AM 07/16/2021   10:42 AM 07/14/2020    2:39 PM  6CIT Screen  What Year? 0 points 0 points 0 points  What month? 0 points 0 points 0 points  What time? 0 points 0 points 0 points  Count back from 20 0 points 0 points 0 points  Months in reverse 0 points 0 points 0 points  Repeat phrase 0 points 0 points 0 points  Total Score 0 points 0 points 0 points    Immunizations Immunization History  Administered Date(s) Administered   PFIZER(Purple Top)SARS-COV-2 Vaccination 04/25/2020, 05/16/2020   Td 07/19/2022    TDAP status: Up to date  Flu Vaccine status: Declined, Education has been provided regarding the importance of this vaccine but patient still declined. Advised may receive this vaccine at local pharmacy or Health Dept. Aware to provide a copy of the vaccination record if obtained from local pharmacy or Health Dept. Verbalized acceptance and understanding.    Covid-19 vaccine status: Information provided on how to obtain vaccines.   Qualifies for Shingles Vaccine? Yes   Zostavax completed No   Shingrix Completed?: No.    Education has been provided regarding the importance of this vaccine. Patient has been advised to call insurance company to determine out of pocket expense if they have not yet received this vaccine. Advised may also receive vaccine at  local pharmacy or Health Dept. Verbalized acceptance and understanding.  Screening Tests Health Maintenance  Topic Date Due   COLONOSCOPY (Pts 45-39yrs Insurance coverage will need to be confirmed)  Never done   MAMMOGRAM  Never done   COVID-19 Vaccine (3 - Pfizer series) 09/20/2022 (Originally 07/11/2020)   Zoster Vaccines- Shingrix (1 of 2)  10/19/2022 (Originally 12/31/2010)   INFLUENZA VACCINE  03/30/2023 (Originally 07/30/2022)   HEMOGLOBIN A1C  12/18/2022   URINE MICROALBUMIN  06/19/2023   PAP SMEAR-Modifier  07/19/2025   TETANUS/TDAP  07/19/2032   Hepatitis C Screening  Completed   HIV Screening  Completed   HPV VACCINES  Aged Out   FOOT EXAM  Discontinued   OPHTHALMOLOGY EXAM  Discontinued    Health Maintenance  Health Maintenance Due  Topic Date Due   COLONOSCOPY (Pts 45-52yrs Insurance coverage will need to be confirmed)  Never done   MAMMOGRAM  Never done    Colorectal cancer screening: Referral to GI placed  . Pt aware the office will call re: appt.  Mammogram status: Ordered  . Pt provided with contact info and advised to call to schedule appt.     Lung Cancer Screening: (Low Dose CT Chest recommended if Age 10-80 years, 30 pack-year currently smoking OR have quit w/in 15years.) does not qualify.   Lung Cancer Screening Referral:   Additional Screening:  Hepatitis C Screening: does not qualify; Completed 2022  Vision Screening: Recommended annual ophthalmology exams for early detection of glaucoma and other disorders of the eye. Is the patient up to date with their annual eye exam?  No  Who is the provider or what is the name of the office in which the patient attends annual eye exams?  If pt is not established with a provider, would they like to be referred to a provider to establish care? No .   Dental Screening: Recommended annual dental exams for proper oral hygiene  Community Resource Referral / Chronic Care Management: CRR required this visit?  No   CCM required this visit?  No      Plan:     I have personally reviewed and noted the following in the patient's chart:   Medical and social history Use of alcohol, tobacco or illicit drugs  Current medications and supplements including opioid prescriptions. Patient is not currently taking opioid prescriptions. Functional ability and  status Nutritional status Physical activity Advanced directives List of other physicians Hospitalizations, surgeries, and ER visits in previous 12 months Vitals Screenings to include cognitive, depression, and falls Referrals and appointments  In addition, I have reviewed and discussed with patient certain preventive protocols, quality metrics, and best practice recommendations. A written personalized care plan for preventive services as well as general preventive health recommendations were provided to patient.     Leroy Kennedy, LPN   [id number]   Nurse Notes:

## 2022-09-08 IMAGING — DX DG KNEE COMPLETE 4+V*R*
4 series · 4 of 4 positions shown · non-contrast
Comparison: None Available.

CLINICAL DATA: Pain

EXAM:
RIGHT KNEE - COMPLETE 4+ VIEW

[knee ap]
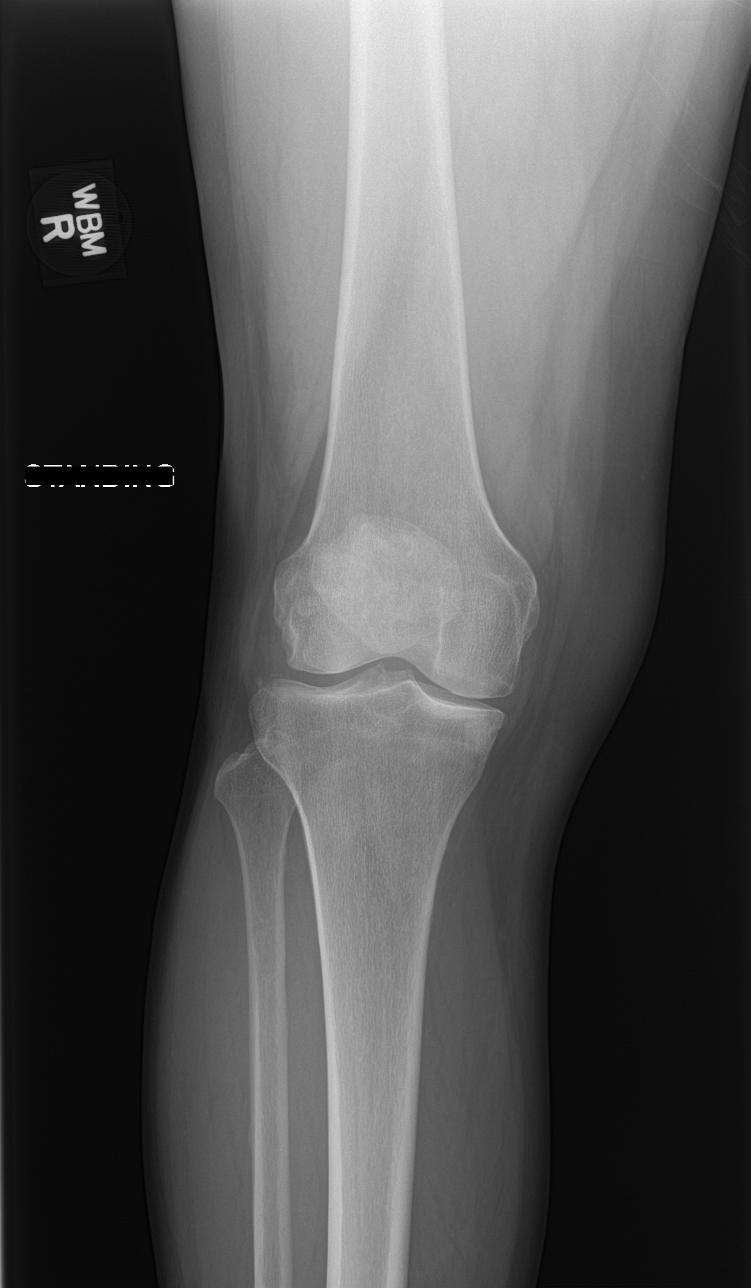

[knee lat]
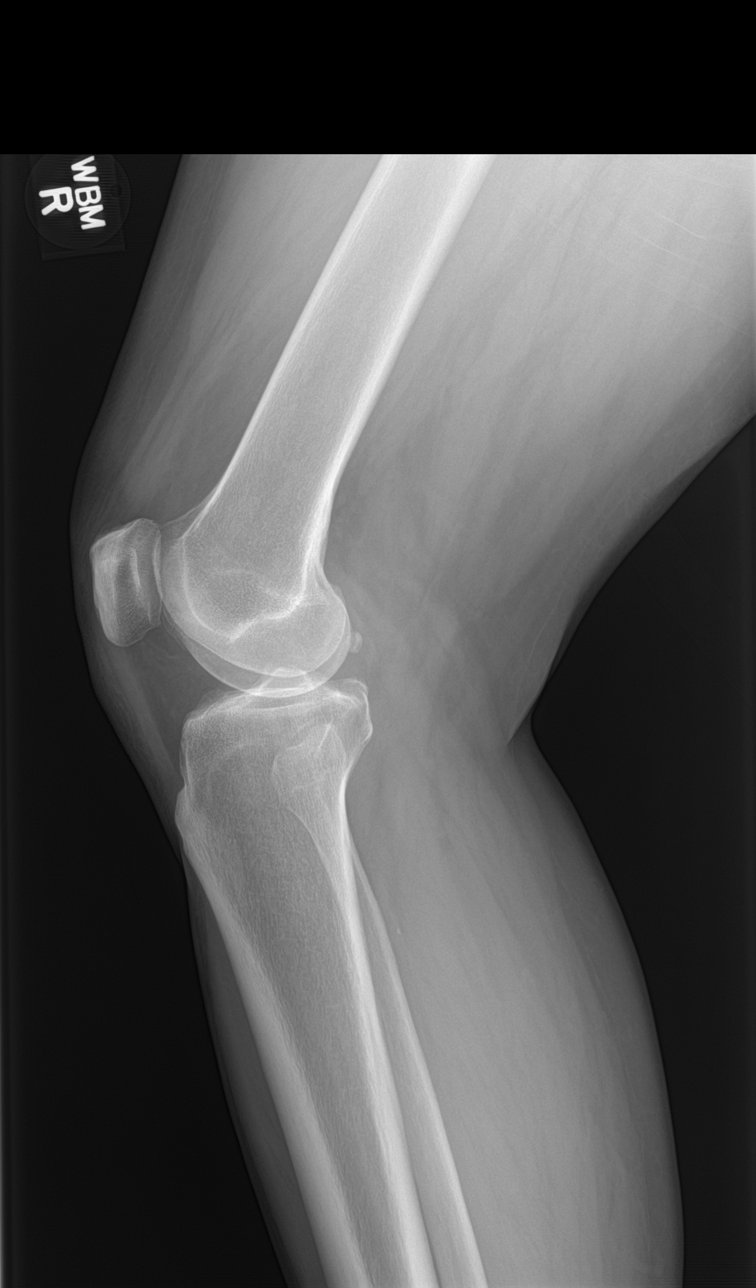

[knee tunnel]
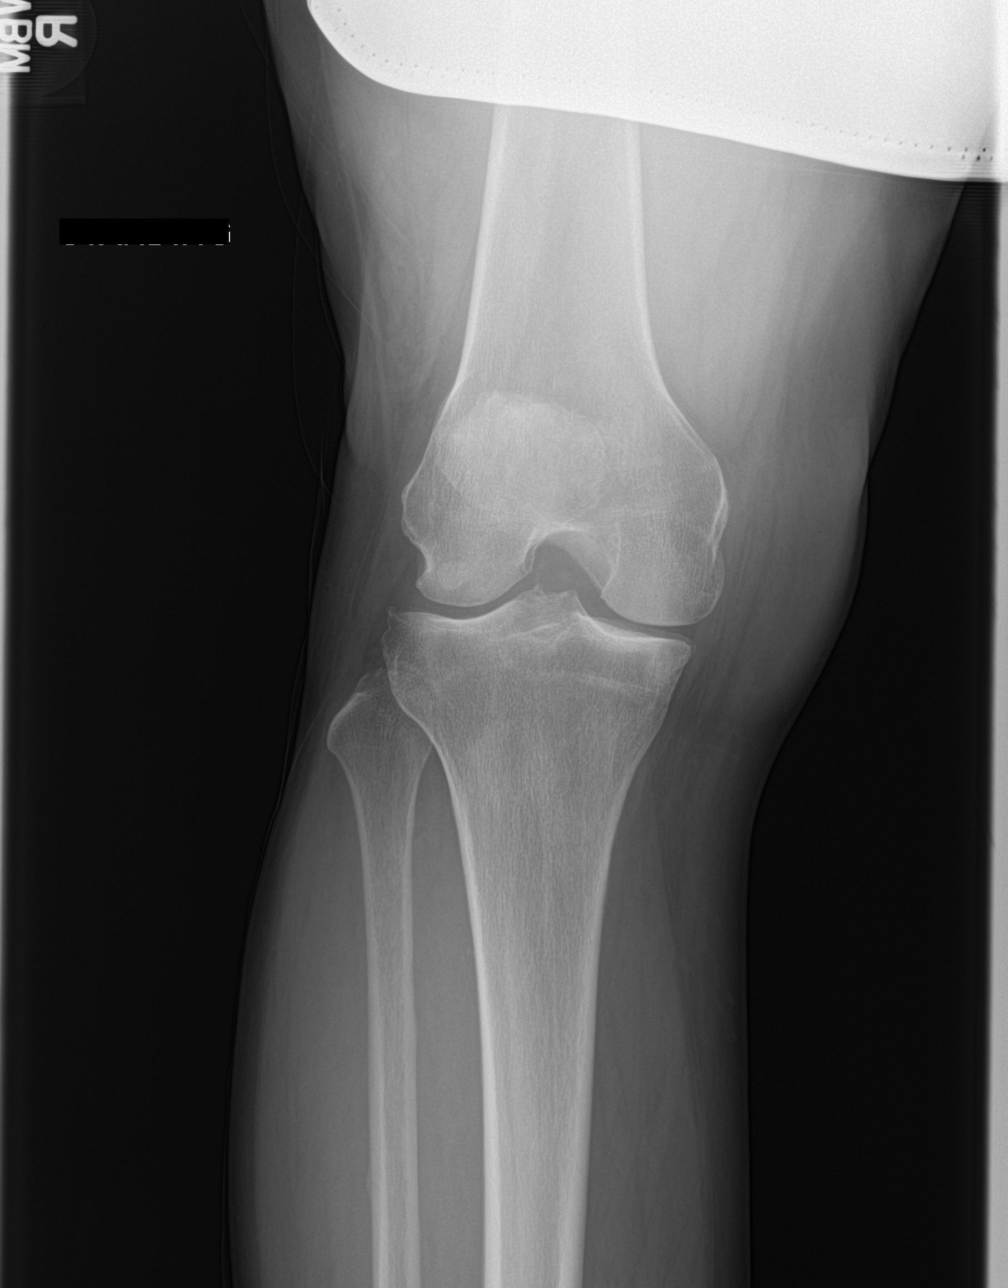

[patella skyline]
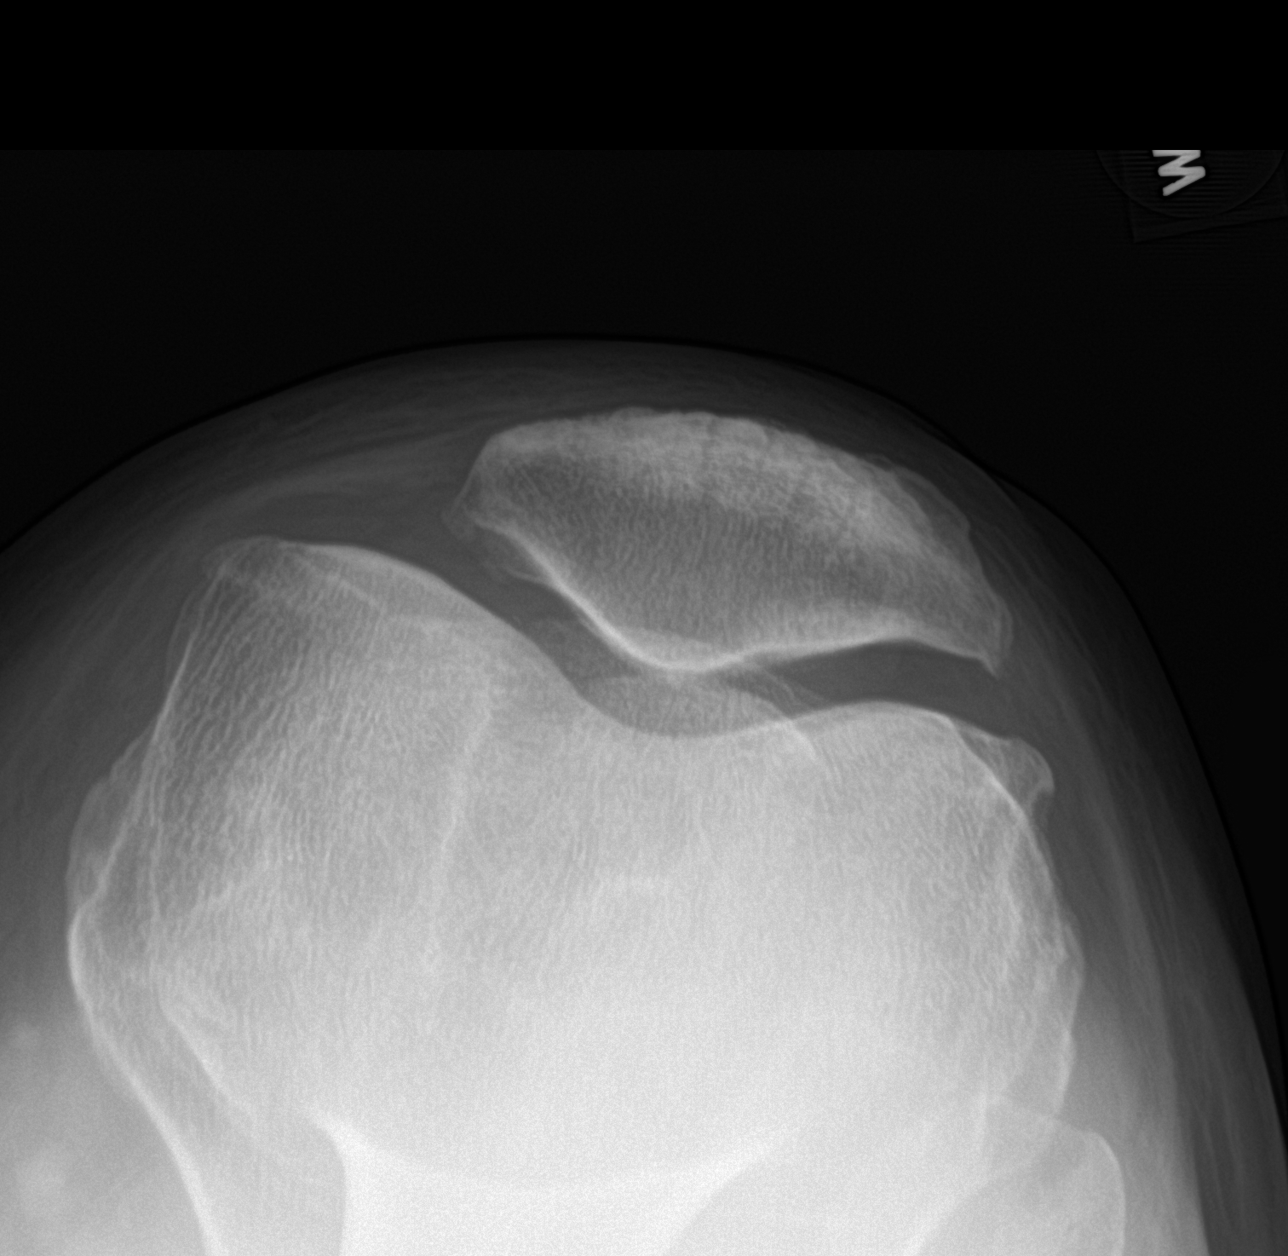

[4 of 4 positions shown; findings below may reference images not displayed]

FINDINGS: No recent fracture or dislocation is seen. There is no effusion in
the suprapatellar bursa. Small smooth marginated calcification
adjacent to the lateral aspect of proximal tibia may be residual
from previous injury. Minimal bony spurs seen in the patella and
lateral compartment.
IMPRESSION: No fracture or dislocation is seen in the right knee. Degenerative
changes with minimal bony spurs seen in the lateral and
patellofemoral compartments.

## 2022-09-08 IMAGING — DX DG KNEE COMPLETE 4+V*L*
4 series · 4 of 4 positions shown · non-contrast
Comparison: None Available.

CLINICAL DATA: Pain

EXAM:
LEFT KNEE - COMPLETE 4 VIEW, weight-bearing

[knee ap]
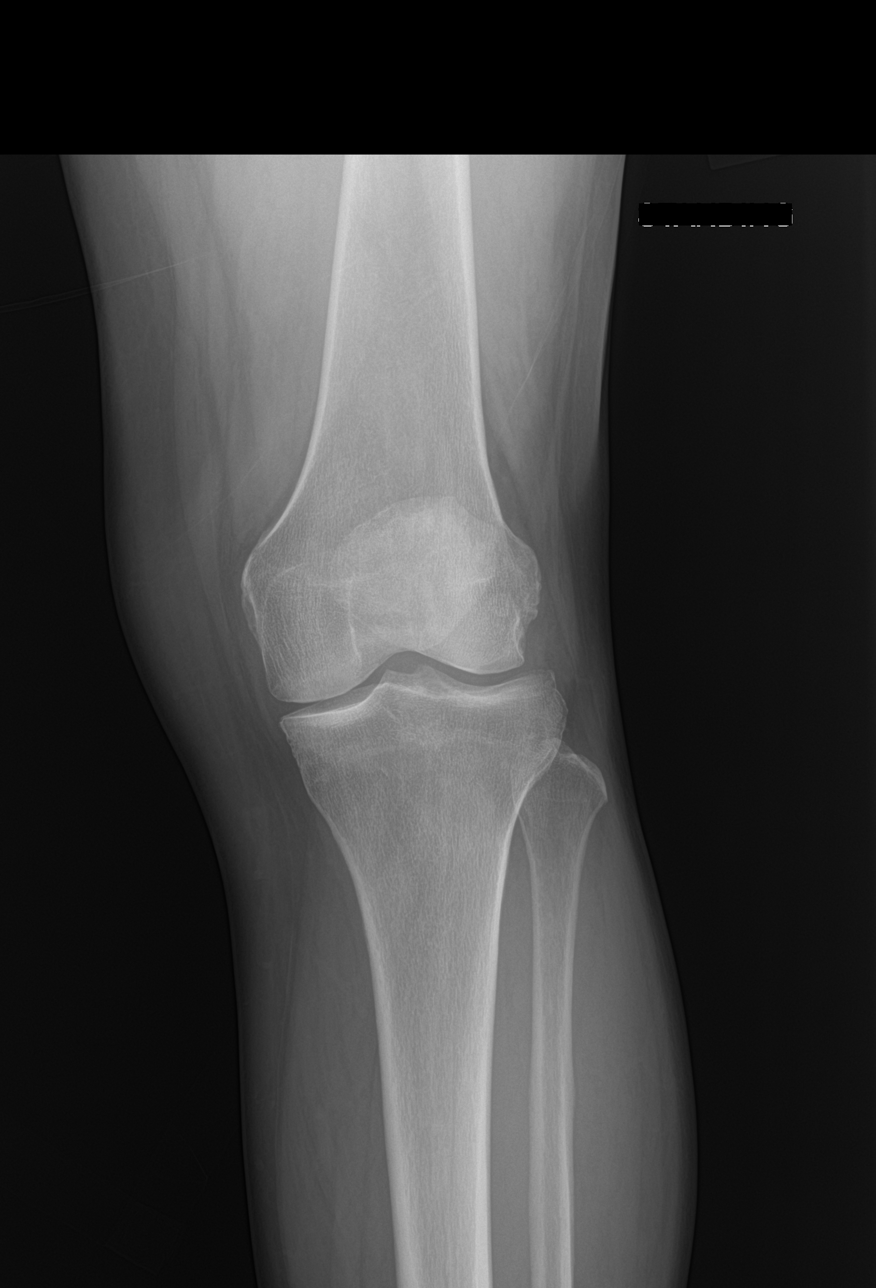

[knee lat]
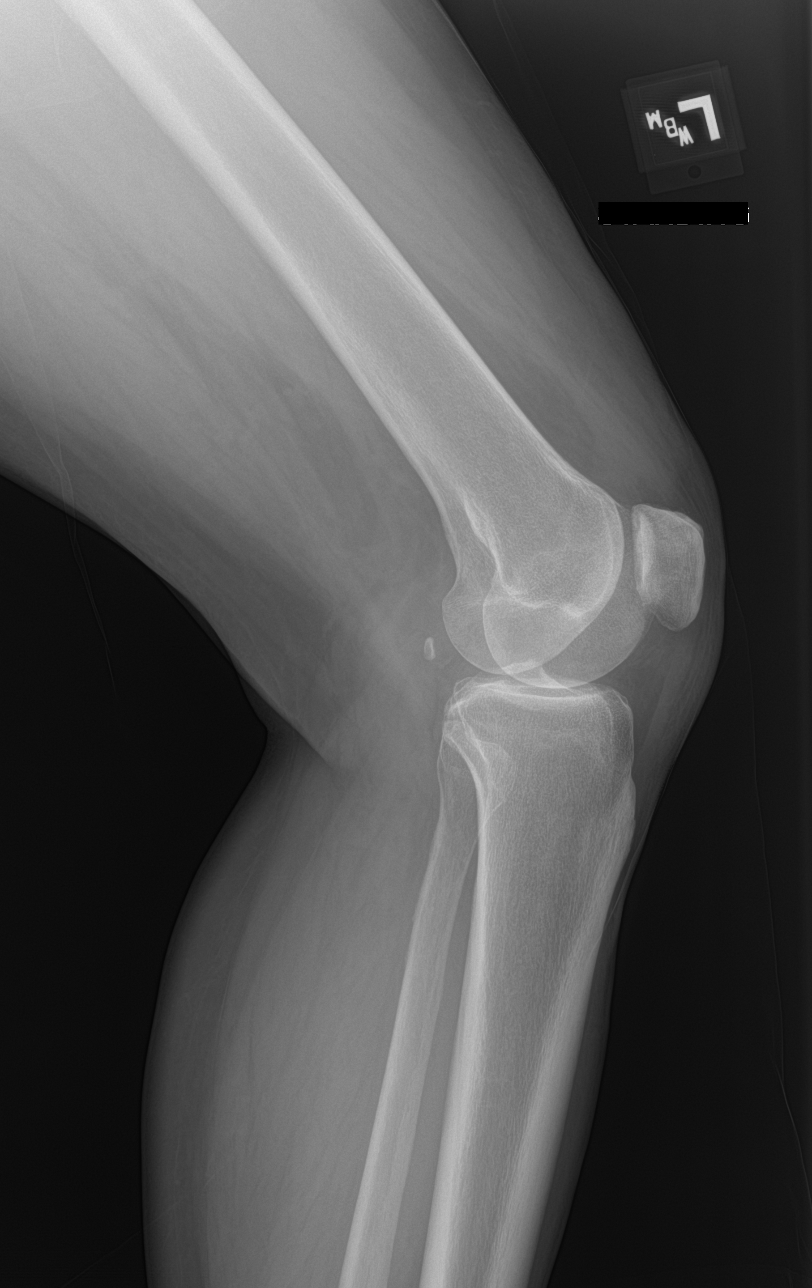

[knee tunnel]
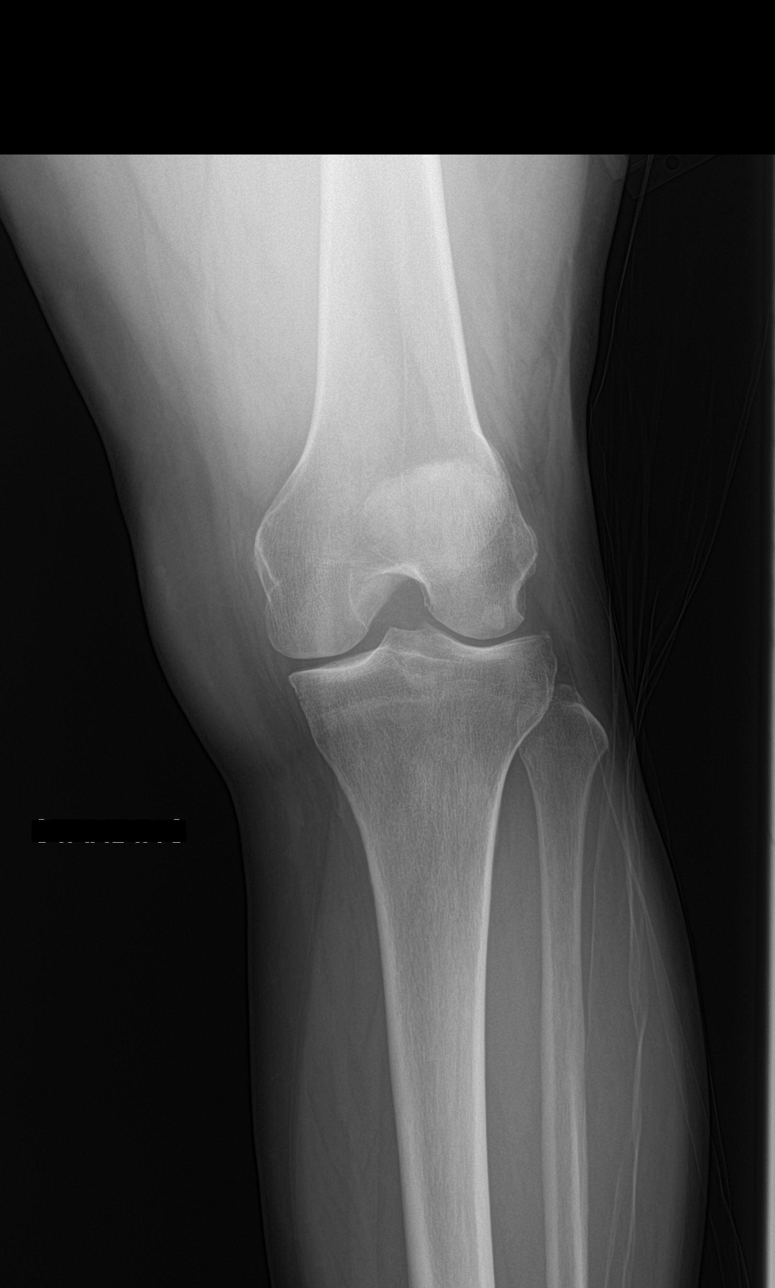

[patella skyline]
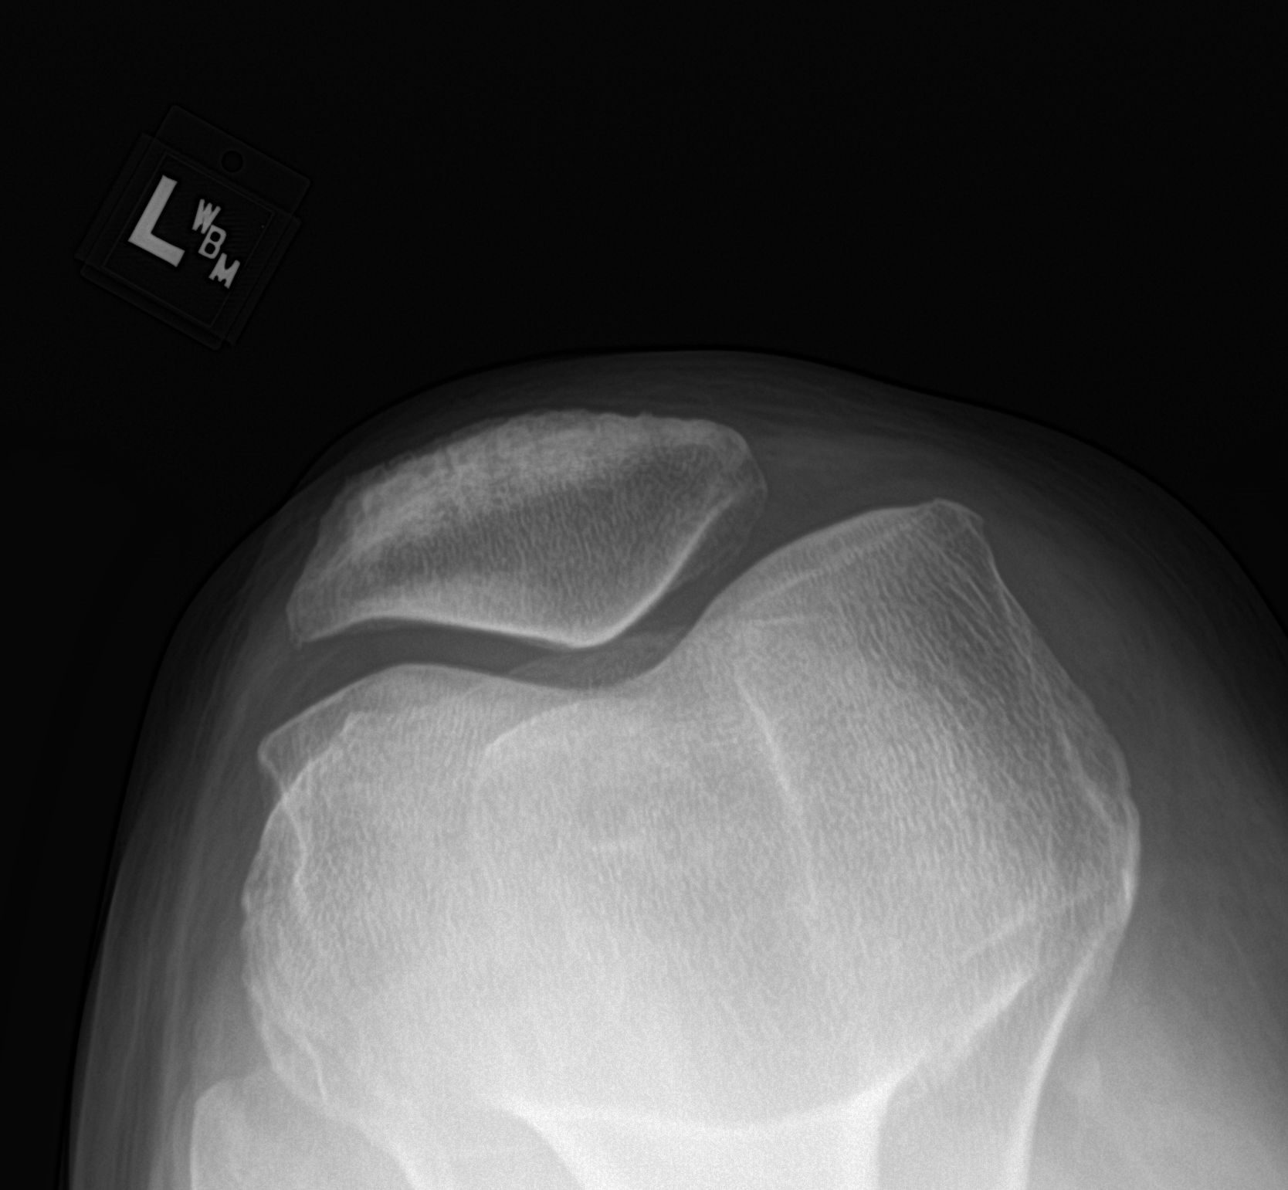

[4 of 4 positions shown; findings below may reference images not displayed]

FINDINGS: No evidence of fracture, dislocation, or joint effusion. No evidence
of arthropathy or other focal bone abnormality. Soft tissues are
unremarkable.
IMPRESSION: No significant radiographic abnormality is seen in the left knee.
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
## 2022-09-12 ENCOUNTER — Ambulatory Visit (INDEPENDENT_AMBULATORY_CARE_PROVIDER_SITE_OTHER): Payer: 59 | Admitting: Psychiatry

## 2022-09-12 ENCOUNTER — Encounter: Payer: Self-pay | Admitting: Psychiatry

## 2022-09-12 VITALS — BP 144/79 | HR 76 | Ht 62.0 in | Wt 171.0 lb

## 2022-09-12 DIAGNOSIS — G4700 Insomnia, unspecified: Secondary | ICD-10-CM

## 2022-09-12 DIAGNOSIS — F209 Schizophrenia, unspecified: Secondary | ICD-10-CM

## 2022-09-12 DIAGNOSIS — F330 Major depressive disorder, recurrent, mild: Secondary | ICD-10-CM | POA: Diagnosis not present

## 2022-09-12 DIAGNOSIS — F4310 Post-traumatic stress disorder, unspecified: Secondary | ICD-10-CM

## 2022-09-12 MED ORDER — TRAZODONE HCL 100 MG PO TABS: 200.0000 mg | ORAL_TABLET | Freq: Every evening | ORAL | 1 refills | Status: DC | PRN

## 2022-09-12 MED ORDER — VENLAFAXINE HCL ER 150 MG PO CP24: 150.0000 mg | ORAL_CAPSULE | Freq: Every day | ORAL | 5 refills | Status: DC

## 2022-09-12 MED ORDER — PALIPERIDONE ER 6 MG PO TB24
6.0000 mg | ORAL_TABLET | Freq: Every day | ORAL | 2 refills | Status: DC
Start: 2022-09-24 — End: 2023-02-03

## 2022-09-12 NOTE — Patient Instructions (Signed)
Continue invega 6 mg at night  Continue venlafaxine 150 mg daily  Restart trazodone 100 mg at night as needed for insomnia Next appointment: 12/4 at 11:30

## 2022-09-16 ENCOUNTER — Telehealth: Payer: Self-pay | Admitting: Nurse Practitioner

## 2022-09-16 ENCOUNTER — Ambulatory Visit: Payer: Self-pay

## 2022-09-16 NOTE — Telephone Encounter (Signed)
  Chief Complaint: Needs lab results faxed to psychiatrist. Symptoms:  Frequency:  Pertinent Negatives: Patient denies  Disposition: [] ED /[] Urgent Care (no appt availability in office) / [] Appointment(In office/virtual)/ []  [facility]/ [] Home Care/ [] Refused Recommended Disposition /[] [facility]/ [x]  Follow-up with PCP Additional Notes: Psychiatrist needs lab results. Dr. Norman Clay Fax number [phone].    Summary: Kidneys & Liver advice   Pt is calling for advice - where her kidneys and liver checked at the time of her July visit. Please advise      Reason for Disposition  Caller requesting lab results  (Exception: Routine or non-urgent lab result.)  Answer Assessment - Initial Assessment Questions 1. REASON FOR CALL or QUESTION: "What is your reason for calling today?" or "How can I best help you?" or "What question do you have that I can help answer?"     Pt needs labs results for kidney and liver tests sent to psychiatrist. 2. CALLER: Document the source of call. (e.g., laboratory, patient).     Pt.  Protocols used: PCP Call - No Triage-A-AH

## 2022-09-16 NOTE — Telephone Encounter (Signed)
Pt is calling to request lab results faxed to psychiatrist today

## 2022-09-16 NOTE — Telephone Encounter (Signed)
Labs faxed

## 2022-09-16 NOTE — Telephone Encounter (Signed)
Summary: Kidneys & Liver advice   Pt is calling for advice - where her kidneys and liver checked at the time of her July visit. Please advise       Called pt [id number].

## 2022-09-16 NOTE — Telephone Encounter (Signed)
Yes I have faxed this over for the patient

## 2022-11-18 ENCOUNTER — Other Ambulatory Visit: Payer: Self-pay | Admitting: Psychiatry

## 2022-11-25 ENCOUNTER — Other Ambulatory Visit: Payer: Self-pay | Admitting: Psychiatry

## 2022-11-25 NOTE — Telephone Encounter (Signed)
Ordered refill. (Per chart review, she should have medication to last until next visit- but the order was sent in this time)

## 2022-11-25 NOTE — Telephone Encounter (Signed)
Pt called, very upset, stating that the pharmacy does not have an RX for her Trazodone. She is asking for refill to be sent to [facility]. Last visit 09/12/22 next visit 12/02/22.
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
## 2022-12-02 ENCOUNTER — Telehealth: Payer: Self-pay | Admitting: Nurse Practitioner

## 2022-12-02 ENCOUNTER — Ambulatory Visit (INDEPENDENT_AMBULATORY_CARE_PROVIDER_SITE_OTHER): Payer: 59 | Admitting: Psychiatry

## 2022-12-02 ENCOUNTER — Encounter: Payer: Self-pay | Admitting: Psychiatry

## 2022-12-02 VITALS — BP 165/83 | HR 106 | Temp 98.7°F | Ht 62.0 in | Wt 184.2 lb

## 2022-12-02 DIAGNOSIS — F4310 Post-traumatic stress disorder, unspecified: Secondary | ICD-10-CM

## 2022-12-02 DIAGNOSIS — F209 Schizophrenia, unspecified: Secondary | ICD-10-CM | POA: Diagnosis not present

## 2022-12-02 DIAGNOSIS — G4700 Insomnia, unspecified: Secondary | ICD-10-CM

## 2022-12-02 DIAGNOSIS — F330 Major depressive disorder, recurrent, mild: Secondary | ICD-10-CM

## 2022-12-02 MED ORDER — TRAZODONE HCL 100 MG PO TABS: 200.0000 mg | ORAL_TABLET | Freq: Every evening | ORAL | 5 refills | Status: DC | PRN

## 2022-12-02 NOTE — Patient Instructions (Signed)
Continue invega 6 mg at night  Continue venlafaxine 150 mg daily Continue trazodone 200 mg at night as needed for insomnia  Next appointment: 2/12 at 3 PM

## 2022-12-02 NOTE — Telephone Encounter (Signed)
Copied from CRM [id number]. Topic: General - Other >> Dec 02, 2022 11:57 AM Everette C wrote: Reason for CRM: The patient has called to request imaging orders for an EKG   Please contact the patient further when possible

## 2022-12-02 NOTE — Telephone Encounter (Signed)
Returned patients call, patient scheduled 12/11/22 at 3pm

## 2022-12-08 NOTE — Patient Instructions (Signed)
Palpitations Palpitations are feelings that your heartbeat is not normal. Your heartbeat may feel like it is: Uneven (irregular). Faster than normal. Fluttering. Skipping a beat. This is usually not a serious problem. However, a doctor will do tests and check your medical history to make sure that you do not have a serious heart problem. Follow these instructions at home: Watch for any changes in your condition. Tell your doctor about any changes. Take these actions to help manage your symptoms: Eating and drinking Follow instructions from your doctor about things to eat and drink. You may be told to avoid these things: Drinks that have caffeine in them, such as coffee, tea, soft drinks, and energy drinks. Chocolate. Alcohol. Diet pills. Lifestyle     Try to lower your stress. These things can help you relax: Yoga. Deep breathing and meditation. Guided imagery. This is using words and images to create positive thoughts. Exercise, including swimming, jogging, and walking. Tell your doctor if you have more abnormal heartbeats when you are active. If you have chest pain or feel short of breath with exercise, do not keep doing the exercise until you are seen by your doctor. Biofeedback. This is using your mind to control things in your body, such as your heartbeat. Get plenty of rest and sleep. Keep a regular bed time. Do not use drugs, such as cocaine or ecstasy. Do not use marijuana. Do not smoke or use any products that contain nicotine or tobacco. If you need help quitting, ask your doctor. General instructions Take over-the-counter and prescription medicines only as told by your doctor. Keep all follow-up visits. You may need more tests if palpitations do not go away or get worse. Contact a doctor if: You keep having fast or uneven heartbeats for a long time. Your symptoms happen more often. Get help right away if: You have chest pain. You feel short of breath. You have a very  bad headache. You feel dizzy. You faint. These symptoms may be an emergency. Get help right away. Call your local emergency services (911 in the [place] Do not wait to see if the symptoms will go away. Do not drive yourself to the hospital. Summary Palpitations are feelings that your heartbeat is uneven or faster than normal. It may feel like your heart is fluttering or skipping a beat. Avoid food and drinks that may cause this condition. These include caffeine, chocolate, and alcohol. Try to lower your stress. Do not smoke or use drugs. Get help right away if you faint, feel dizzy, feel short of breath, have chest pain, or have a very bad headache. This information is not intended to replace advice given to you by your health care provider. Make sure you discuss any questions you have with your health care provider. Document Revised: 05/09/2021 Document Reviewed: 05/09/2021 Elsevier Patient Education  2023 Elsevier Inc.  

## 2022-12-11 ENCOUNTER — Ambulatory Visit (INDEPENDENT_AMBULATORY_CARE_PROVIDER_SITE_OTHER): Payer: 59 | Admitting: Nurse Practitioner

## 2022-12-11 ENCOUNTER — Encounter: Payer: Self-pay | Admitting: Nurse Practitioner

## 2022-12-11 VITALS — BP 122/73 | HR 98 | Temp 98.4°F | Ht 62.01 in | Wt 182.9 lb

## 2022-12-11 DIAGNOSIS — F203 Undifferentiated schizophrenia: Secondary | ICD-10-CM | POA: Diagnosis not present

## 2022-12-11 DIAGNOSIS — Z79899 Other long term (current) drug therapy: Secondary | ICD-10-CM

## 2022-12-11 DIAGNOSIS — F4310 Post-traumatic stress disorder, unspecified: Secondary | ICD-10-CM

## 2022-12-11 NOTE — Assessment & Plan Note (Signed)
Chronic, ongoing. Continue collaboration with psychiatry and current medication regimen.  She denies SI/HI.  EKG in office today with no QTc prolongation.  Sinus rhythm.  Will fax to psychiatry office and obtain Prolactin level.

## 2022-12-11 NOTE — Progress Notes (Signed)
BP 122/73   Pulse 98   Temp 98.4 F (36.9 C) (Oral)   Ht 5' 2.01" (1.575 m)   Wt 182 lb 14.4 oz (83 kg)   LMP 03/28/2016   SpO2 96%   BMI 33.44 kg/m    Subjective:    Patient ID: Megan [name], female    DOB: 1961/12/06, 61 y.o.   MRN: [id number]  HPI: Megan [name] is a 61 y.o. female  Chief Complaint  Patient presents with   Schizophrenia   SCHIZOPHRENIA Followed by Dr. Modesta Messing at [place] # [phone].  Recent visit on 12/02/22 with her and continues on Invega -- they need an EKG And Prolactin level.  To fax the EKG to Dr. Modesta Messing.  Mood status: stable Satisfied with current treatment?: yes Symptom severity: moderate  Duration of current treatment : chronic Side effects: no Medication compliance: good compliance Psychotherapy/counseling: yes in the past Previous psychiatric medications: multiple medications Depressed mood: no Anxious mood: no Anhedonia: no Significant weight loss or gain: no Insomnia: yes hard to stay asleep Fatigue: no Feelings of worthlessness or guilt: no Impaired concentration/indecisiveness: no Suicidal ideations: no Hopelessness: no Crying spells: no    12/11/2022    3:03 PM 12/02/2022   12:12 PM 09/12/2022   11:02 AM 09/04/2022    9:10 AM 07/25/2022   11:11 AM  Depression screen PHQ 2/9  Decreased Interest 0 1 1 0 1  Down, Depressed, Hopeless 0 1 1 2 1   PHQ - 2 Score 0 2 2 2 2   Altered sleeping 0 2 0 3 3  Tired, decreased energy 0  2 3 2   Change in appetite 0 1 0 0 1  Feeling bad or failure about yourself  0 1 0 0 1  Trouble concentrating 0 2 1 0 1  Moving slowly or fidgety/restless 0 1 0 0 1  Suicidal thoughts 0 0 0 0 0  PHQ-9 Score 0 9 5 8 11   Difficult doing work/chores Not difficult at all  Not difficult at all  Somewhat difficult       12/11/2022    3:03 PM 12/02/2022   12:13 PM 09/12/2022   11:03 AM 07/25/2022   11:12 AM  GAD 7 : Generalized Anxiety Score  Nervous, Anxious, on Edge 0 2 1 3   Control/stop  worrying 0 1 1 3   Worry too much - different things 0 2 1 3   Trouble relaxing 0 2 0 3  Restless 0 1 0 3  Easily annoyed or irritable 0 1 1 1   Afraid - awful might happen 0 2 0 2  Total GAD 7 Score 0 11 4 18   Anxiety Difficulty Not difficult at all Somewhat difficult Not difficult at all Somewhat difficult    Relevant past medical, surgical, family and social history reviewed and updated as indicated. Interim medical history since our last visit reviewed. Allergies and medications reviewed and updated.  Review of Systems  Constitutional:  Negative for activity change, appetite change, diaphoresis, fatigue and fever.  Respiratory:  Negative for cough, chest tightness and shortness of breath.   Cardiovascular:  Negative for chest pain, palpitations and leg swelling.  Gastrointestinal: Negative.   Neurological: Negative.   Psychiatric/Behavioral: Negative.     Per HPI unless specifically indicated above     Objective:    BP 122/73   Pulse 98   Temp 98.4 F (36.9 C) (Oral)   Ht 5' 2.01" (1.575 m)   Wt 182 lb 14.4 oz (  83 kg)   LMP 03/28/2016   SpO2 96%   BMI 33.44 kg/m   Wt Readings from Last 3 Encounters:  12/11/22 182 lb 14.4 oz (83 kg)  12/02/22 184 lb 3.2 oz (83.6 kg)  09/12/22 171 lb (77.6 kg)    Physical Exam Vitals and nursing note reviewed.  Constitutional:      General: She is awake. She is not in acute distress.    Appearance: She is well-developed and well-groomed. She is obese. She is not ill-appearing or toxic-appearing.  HENT:     Head: Normocephalic.     Right Ear: Hearing normal.     Left Ear: Hearing normal.     Nose: Nose normal.     Mouth/Throat:     Mouth: Mucous membranes are moist.  Eyes:     General: Lids are normal.        Right eye: No discharge.        Left eye: No discharge.     Conjunctiva/sclera: Conjunctivae normal.     Pupils: Pupils are equal, round, and reactive to light.  Neck:     Thyroid: No thyromegaly.     Vascular: No  carotid bruit or JVD.  Cardiovascular:     Rate and Rhythm: Normal rate and regular rhythm.     Heart sounds: Normal heart sounds. No murmur heard.    No gallop.  Pulmonary:     Effort: Pulmonary effort is normal.     Breath sounds: Normal breath sounds.  Abdominal:     General: Bowel sounds are normal.     Palpations: Abdomen is soft.  Musculoskeletal:     Cervical back: Normal range of motion and neck supple.     Right lower leg: No edema.     Left lower leg: No edema.  Lymphadenopathy:     Cervical: No cervical adenopathy.  Skin:    General: Skin is warm and dry.  Neurological:     Mental Status: She is alert and oriented to person, place, and time.  Psychiatric:        Attention and Perception: Attention normal.        Mood and Affect: Mood normal.        Behavior: Behavior normal. Behavior is cooperative.        Thought Content: Thought content normal.        Judgment: Judgment normal.   EKG My review and personal interpretation at Time: 1530 Indication: medication assessment Rate: 96  Rhythm: sinus Axis: normal Other: no nonspecific st abn, no stemi, no lvh -- QTc = 420 on calculation  Results for orders placed or performed in visit on 07/19/22  Lipid Panel w/o Chol/HDL Ratio  Result Value Ref Range   Cholesterol, Total 187 100 - 199 mg/dL   Triglycerides 854 (H) 0 - 149 mg/dL   HDL 59 >62 mg/dL   VLDL Cholesterol Cal 34 5 - 40 mg/dL   LDL Chol Calc (NIH) 94 0 - 99 mg/dL  VITAMIN D 25 Hydroxy (Vit-D Deficiency, Fractures)  Result Value Ref Range   Vit D, 25-Hydroxy 16.4 (L) 30.0 - 100.0 ng/mL  Cytology - PAP  Result Value Ref Range   High risk HPV Other    Adequacy      UNSATISFACTORY for evaluation due to obscuring inflammation. The   Adequacy      specimen is processed and examined microscopically, but is found to be   Adequacy      unsatisfactory for evaluation of  an epithelial abnormality. Repeat study   Adequacy recommended.    Diagnosis -  Non-diagnostic (A)    Microorganisms Trichomonas vaginalis present    Molecular Comment Recommend recollection and testing.       Assessment & Plan:   Problem List Items Addressed This Visit       Other   PTSD (post-traumatic stress disorder)    Chronic, ongoing.  She denies SI/HI.  Continue collaboration with psychiatry and current medication regimen.        Relevant Orders   EKG 12-Lead (Completed)   Prolactin   Schizophrenia, undifferentiated (HCC) - Primary    Chronic, ongoing. Continue collaboration with psychiatry and current medication regimen.  She denies SI/HI.  EKG in office today with no QTc prolongation.  Sinus rhythm.  Will fax to psychiatry office and obtain Prolactin level.      Relevant Orders   EKG 12-Lead (Completed)   Prolactin   Other Visit Diagnoses     High risk medication use       EKG in office and Prolactin level today.   Relevant Orders   EKG 12-Lead (Completed)   Prolactin        Follow up plan: Return for as scheduled in January.

## 2022-12-11 NOTE — Assessment & Plan Note (Signed)
Chronic, ongoing.  She denies SI/HI.  Continue collaboration with psychiatry and current medication regimen.   

## 2022-12-12 ENCOUNTER — Telehealth: Payer: Self-pay | Admitting: Psychiatry

## 2022-12-12 LAB — PROLACTIN: Prolactin: 27.6 ng/mL — ABNORMAL HIGH (ref 4.8–23.3)

## 2022-12-12 NOTE — Telephone Encounter (Signed)
Reviewed EKG. QTc H 406 msec on 12/11/2022. Prolactin 27.6 Will plan to continue current medication as discussed.

## 2022-12-12 NOTE — Progress Notes (Signed)
Please let Lavanda know her Prolactin returned and if mildly elevated, very mild.  I did send result to Dr. Vanetta Shawl for review.  Have a great day!!

## 2023-01-18 NOTE — Patient Instructions (Signed)

## 2023-01-20 ENCOUNTER — Encounter: Payer: Self-pay | Admitting: Nurse Practitioner

## 2023-01-20 ENCOUNTER — Ambulatory Visit (INDEPENDENT_AMBULATORY_CARE_PROVIDER_SITE_OTHER): Payer: 59 | Admitting: Nurse Practitioner

## 2023-01-20 VITALS — BP 132/78 | HR 85 | Temp 97.9°F | Ht 62.01 in | Wt 182.7 lb

## 2023-01-20 DIAGNOSIS — F203 Undifferentiated schizophrenia: Secondary | ICD-10-CM | POA: Diagnosis not present

## 2023-01-20 DIAGNOSIS — R7301 Impaired fasting glucose: Secondary | ICD-10-CM

## 2023-01-20 DIAGNOSIS — E782 Mixed hyperlipidemia: Secondary | ICD-10-CM | POA: Diagnosis not present

## 2023-01-20 DIAGNOSIS — F4310 Post-traumatic stress disorder, unspecified: Secondary | ICD-10-CM

## 2023-01-20 DIAGNOSIS — E559 Vitamin D deficiency, unspecified: Secondary | ICD-10-CM

## 2023-01-20 DIAGNOSIS — R030 Elevated blood-pressure reading, without diagnosis of hypertension: Secondary | ICD-10-CM

## 2023-01-20 DIAGNOSIS — E6609 Other obesity due to excess calories: Secondary | ICD-10-CM

## 2023-01-20 DIAGNOSIS — Z6831 Body mass index (BMI) 31.0-31.9, adult: Secondary | ICD-10-CM

## 2023-01-20 NOTE — Assessment & Plan Note (Signed)
Chronic, ongoing.  She denies SI/HI.  Continue collaboration with psychiatry and current medication regimen.

## 2023-01-20 NOTE — Assessment & Plan Note (Signed)
Chronic, ongoing. Continue collaboration with psychiatry and current medication regimen as prescribed by them.  She denies SI/HI.  Recent notes reviewed.

## 2023-01-20 NOTE — Assessment & Plan Note (Signed)
History of elevation, she reports she is fasting today.  Sister with history of CVA, discussed her current ASCVD 14% with her and recommend medication, has refused in past.  Continue to recommend statin use.

## 2023-01-20 NOTE — Assessment & Plan Note (Signed)
Stable on recent labs at 5.6% -- will recheck today and recommend continued focus on regular exercise and healthy diet.

## 2023-01-20 NOTE — Assessment & Plan Note (Signed)
At goal in office today.  She stopped Losartan in past due to dizziness.  Recommend continue diet focus, with DASH diet.  Check BP at home 3 days a week and report if consistent above 130/80.  If need to start medication consider Lisinopril 2.5 MG or 5 MG, low dose to start.  Check CMP today.  Return in 6 months.

## 2023-01-20 NOTE — Assessment & Plan Note (Signed)
Chronic, ongoing.  Recommend continue supplement weekly and recheck level today.

## 2023-01-20 NOTE — Progress Notes (Signed)
BP 132/78 (BP Location: Left Arm, Patient Position: Sitting, Cuff Size: Normal)   Pulse 85   Temp 97.9 F (36.6 C) (Oral)   Ht 5' 2.01" (1.575 m)   Wt 182 lb 11.2 oz (82.9 kg)   LMP 03/28/2016   SpO2 96%   BMI 33.41 kg/m    Subjective:    Patient ID: Megan [name], female    DOB: 1961/08/09, 62 y.o.   MRN: [id number]  HPI: Megan [name] is a 62 y.o. female  Chief Complaint  Patient presents with   Mood   vit D   Prediabetes   HYPERLIPIDEMIA Noted on past labs -- last LDL 94.  She is taking Vitamin D supplement + B12.  Last A1c was 5.6% June.   Supplements: none Aspirin:  no The 10-year ASCVD risk score (Arnett DK, et al., 2019) is: 14%   Values used to calculate the score:     Age: 80 years     Sex: Female     Is Non-Hispanic African American: Yes     Diabetic: Yes     Tobacco smoker: No     Systolic Blood Pressure: 875 mmHg     Is BP treated: No     HDL Cholesterol: 59 mg/dL     Total Cholesterol: 187 mg/dL Chest pain:  no Coronary artery disease:  no Family history CAD:  no Family history early CAD:  no   SCHIZOPHRENIA Followed by Dr. Modesta Messing at [facility], last visit 12/02/22 -- next on 02/10/23.  Continues on Effexor, Invega, Trazodone. Satisfied with current treatment?: yes Symptom severity: moderate  Duration of current treatment : chronic Side effects: no Medication compliance: good compliance Psychotherapy/counseling: yes in the past Previous psychiatric medications: multiple medications Depressed mood: no Anxious mood: no Anhedonia: no Significant weight loss or gain: no Insomnia: no Fatigue: no Feelings of worthlessness or guilt: no Impaired concentration/indecisiveness: no Suicidal ideations: no Hopelessness: no Crying spells: no    01/20/2023    9:05 AM 12/11/2022    3:03 PM 12/02/2022   12:12 PM 09/12/2022   11:02 AM 09/04/2022    9:10 AM  Depression screen PHQ 2/9  Decreased Interest 0 0   0  Down, Depressed, Hopeless 0 0   2   PHQ - 2 Score 0 0   2  Altered sleeping 0 0   3  Tired, decreased energy 0 0   3  Change in appetite 0 0   0  Feeling bad or failure about yourself  0 0   0  Trouble concentrating 0 0   0  Moving slowly or fidgety/restless 0 0   0  Suicidal thoughts 0 0   0  PHQ-9 Score 0 0   8  Difficult doing work/chores Not difficult at all Not difficult at all        Information is confidential and restricted. Go to Review Flowsheets to unlock data.       01/20/2023    9:05 AM 12/11/2022    3:03 PM 12/02/2022   12:13 PM 09/12/2022   11:03 AM  GAD 7 : Generalized Anxiety Score  Nervous, Anxious, on Edge 0 0    Control/stop worrying 0 0    Worry too much - different things 0 0    Trouble relaxing 0 0    Restless 0 0    Easily annoyed or irritable 0 0    Afraid - awful might happen 0 0    Total GAD  7 Score 0 0    Anxiety Difficulty Not difficult at all Not difficult at all       Information is confidential and restricted. Go to Review Flowsheets to unlock data.    Relevant past medical, surgical, family and social history reviewed and updated as indicated. Interim medical history since our last visit reviewed. Allergies and medications reviewed and updated.  Review of Systems  Constitutional:  Negative for activity change, appetite change, diaphoresis, fatigue and fever.  Respiratory:  Negative for cough, chest tightness and shortness of breath.   Cardiovascular:  Negative for chest pain, palpitations and leg swelling.  Gastrointestinal: Negative.   Neurological: Negative.   Psychiatric/Behavioral: Negative.     Per HPI unless specifically indicated above     Objective:    BP 132/78 (BP Location: Left Arm, Patient Position: Sitting, Cuff Size: Normal)   Pulse 85   Temp 97.9 F (36.6 C) (Oral)   Ht 5' 2.01" (1.575 m)   Wt 182 lb 11.2 oz (82.9 kg)   LMP 03/28/2016   SpO2 96%   BMI 33.41 kg/m   Wt Readings from Last 3 Encounters:  01/20/23 182 lb 11.2 oz (82.9 kg)  12/11/22  182 lb 14.4 oz (83 kg)  07/19/22 171 lb 3.2 oz (77.7 kg)    Physical Exam Vitals and nursing note reviewed.  Constitutional:      General: She is awake. She is not in acute distress.    Appearance: She is well-developed and well-groomed. She is obese. She is not ill-appearing or toxic-appearing.  HENT:     Head: Normocephalic.     Right Ear: Hearing normal.     Left Ear: Hearing normal.     Nose: Nose normal.     Mouth/Throat:     Mouth: Mucous membranes are moist.  Eyes:     General: Lids are normal.        Right eye: No discharge.        Left eye: No discharge.     Conjunctiva/sclera: Conjunctivae normal.     Pupils: Pupils are equal, round, and reactive to light.  Neck:     Thyroid: No thyromegaly.     Vascular: No carotid bruit or JVD.  Cardiovascular:     Rate and Rhythm: Normal rate and regular rhythm.     Heart sounds: Normal heart sounds. No murmur heard.    No gallop.  Pulmonary:     Effort: Pulmonary effort is normal.     Breath sounds: Normal breath sounds.  Abdominal:     General: Bowel sounds are normal.     Palpations: Abdomen is soft.  Musculoskeletal:     Cervical back: Normal range of motion and neck supple.     Right lower leg: No edema.     Left lower leg: No edema.  Lymphadenopathy:     Cervical: No cervical adenopathy.  Skin:    General: Skin is warm and dry.  Neurological:     Mental Status: She is alert and oriented to person, place, and time.  Psychiatric:        Attention and Perception: Attention normal.        Mood and Affect: Mood normal.        Behavior: Behavior normal. Behavior is cooperative.        Thought Content: Thought content normal.        Judgment: Judgment normal.    Results for orders placed or performed in visit on 12/11/22  Prolactin  Result Value Ref Range   Prolactin 27.6 (H) 4.8 - 23.3 ng/mL      Assessment & Plan:   Problem List Items Addressed This Visit       Endocrine   IFG (impaired fasting glucose)     Stable on recent labs at 5.6% -- will recheck today and recommend continued focus on regular exercise and healthy diet.      Relevant Orders   HgB A1c     Other   Elevated blood pressure reading    At goal in office today.  She stopped Losartan in past due to dizziness.  Recommend continue diet focus, with DASH diet.  Check BP at home 3 days a week and report if consistent above 130/80.  If need to start medication consider Lisinopril 2.5 MG or 5 MG, low dose to start.  Check CMP today.  Return in 6 months.      Relevant Orders   Comprehensive metabolic panel   Hyperlipidemia    History of elevation, she reports she is fasting today.  Sister with history of CVA, discussed her current ASCVD 14% with her and recommend medication, has refused in past.  Continue to recommend statin use.      Relevant Orders   Comprehensive metabolic panel   Lipid Panel w/o Chol/HDL Ratio   Obesity    BMI 33.41.  Recommended eating smaller high protein, low fat meals more frequently and exercising 30 mins a day 5 times a week with a goal of 10-15lb weight loss in the next 3 months. Patient voiced their understanding and motivation to adhere to these recommendations.       PTSD (post-traumatic stress disorder)    Chronic, ongoing.  She denies SI/HI.  Continue collaboration with psychiatry and current medication regimen.        Schizophrenia, undifferentiated (HCC) - Primary    Chronic, ongoing. Continue collaboration with psychiatry and current medication regimen as prescribed by them.  She denies SI/HI.  Recent notes reviewed.      Vitamin D deficiency    Chronic, ongoing.  Recommend continue supplement weekly and recheck level today.      Relevant Orders   VITAMIN D 25 Hydroxy (Vit-D Deficiency, Fractures)     Follow up plan: Return in about 6 months (around 07/21/2023) for Annual Physical.

## 2023-01-20 NOTE — Assessment & Plan Note (Signed)
BMI 33.41. Recommended eating smaller high protein, low fat meals more frequently and exercising 30 mins a day 5 times a week with a goal of 10-15lb weight loss in the next 3 months. Patient voiced their understanding and motivation to adhere to these recommendations.  

## 2023-01-21 LAB — LIPID PANEL W/O CHOL/HDL RATIO
Cholesterol, Total: 252 mg/dL — ABNORMAL HIGH (ref 100–199)
HDL: 69 mg/dL (ref >39)
LDL Chol Calc (NIH): 132 mg/dL — ABNORMAL HIGH (ref 0–99)
Triglycerides: 287 mg/dL — ABNORMAL HIGH (ref 0–149)
VLDL Cholesterol Cal: 51 mg/dL — ABNORMAL HIGH (ref 5–40)

## 2023-01-21 LAB — COMPREHENSIVE METABOLIC PANEL
ALT: 41 IU/L — ABNORMAL HIGH (ref 0–32)
AST: 42 IU/L — ABNORMAL HIGH (ref 0–40)
Albumin/Globulin Ratio: 2 (ref 1.2–2.2)
Albumin: 4.2 g/dL (ref 3.9–4.9)
Alkaline Phosphatase: 47 IU/L (ref 44–121)
BUN/Creatinine Ratio: 19 (ref 12–28)
BUN: 16 mg/dL (ref 8–27)
Bilirubin Total: <0.2 mg/dL (ref 0.0–1.2)
CO2: 22 mmol/L (ref 20–29)
Calcium: 8.9 mg/dL (ref 8.7–10.3)
Chloride: 105 mmol/L (ref 96–106)
Creatinine, Ser: 0.86 mg/dL (ref 0.57–1.00)
Globulin, Total: 2.1 g/dL (ref 1.5–4.5)
Glucose: 115 mg/dL — ABNORMAL HIGH (ref 70–99)
Potassium: 4.2 mmol/L (ref 3.5–5.2)
Sodium: 142 mmol/L (ref 134–144)
Total Protein: 6.3 g/dL (ref 6.0–8.5)
eGFR: 76 mL/min/{1.73_m2} (ref >59)

## 2023-01-21 LAB — VITAMIN D 25 HYDROXY (VIT D DEFICIENCY, FRACTURES): Vit D, 25-Hydroxy: 30.4 ng/mL (ref 30.0–100.0)

## 2023-01-21 LAB — HEMOGLOBIN A1C
Est. average glucose Bld gHb Est-mCnc: 134 mg/dL
Hgb A1c MFr Bld: 6.3 % — ABNORMAL HIGH (ref 4.8–5.6)

## 2023-01-21 NOTE — Progress Notes (Signed)
Good morning, please let Micayla know her labs have returned: - A1c, diabetes testing, is trending up now at 6.3%.  Previous was 5.6% in June last year.  Goal is to avoid this from increasing to 6.5% or greater, which is diabetes.  Currently you remain prediabetic.  Big focus is diet for this, decreasing foods high in sugar and carbohydrates + exercising regularly. - Kidney function, creatinine and eGFR, remains normal.  However, liver function levels have trended up a little.  We will recheck these next visit, try to cut back on any alcohol or Tylenol if used. - Cholesterol levels remain a bit elevated, I would like to recheck with you fasting next visit and we will consider starting medication if still elevated. - Vitamin D normal, continue supplement.  Any questions? Keep being amazing!!  Thank you for allowing me to participate in your care.  I appreciate you. Kindest regards, Kypton [place]

## 2023-02-03 ENCOUNTER — Other Ambulatory Visit: Payer: Self-pay | Admitting: Psychiatry
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
## 2023-02-10 ENCOUNTER — Encounter: Payer: Self-pay | Admitting: Psychiatry

## 2023-02-10 ENCOUNTER — Ambulatory Visit (INDEPENDENT_AMBULATORY_CARE_PROVIDER_SITE_OTHER): Payer: 59 | Admitting: Psychiatry

## 2023-02-10 VITALS — BP 150/84 | HR 93 | Temp 98.2°F | Ht 62.0 in | Wt 181.2 lb

## 2023-02-10 DIAGNOSIS — F209 Schizophrenia, unspecified: Secondary | ICD-10-CM

## 2023-02-10 DIAGNOSIS — F4310 Post-traumatic stress disorder, unspecified: Secondary | ICD-10-CM | POA: Diagnosis not present

## 2023-02-10 DIAGNOSIS — F3341 Major depressive disorder, recurrent, in partial remission: Secondary | ICD-10-CM

## 2023-02-10 DIAGNOSIS — G4700 Insomnia, unspecified: Secondary | ICD-10-CM

## 2023-02-10 MED ORDER — VENLAFAXINE HCL ER 150 MG PO CP24: 150.0000 mg | ORAL_CAPSULE | Freq: Every day | ORAL | 5 refills | Status: DC

## 2023-02-10 NOTE — Patient Instructions (Signed)
Continue Invega 6 mg at night) Continue venlafaxine 150 mg daily - monitor BP Continue trazodone 200 mg at night as needed for insomnia  Next appointment: 5/6 at 4 PM

## 2023-04-24 ENCOUNTER — Other Ambulatory Visit: Payer: Self-pay | Admitting: Psychiatry
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
## 2023-05-05 ENCOUNTER — Encounter: Payer: Self-pay | Admitting: Psychiatry

## 2023-05-05 ENCOUNTER — Ambulatory Visit (INDEPENDENT_AMBULATORY_CARE_PROVIDER_SITE_OTHER): Payer: 59 | Admitting: Psychiatry

## 2023-05-05 VITALS — BP 146/82 | HR 103 | Temp 97.8°F | Ht 62.0 in | Wt 180.8 lb

## 2023-05-05 DIAGNOSIS — F3341 Major depressive disorder, recurrent, in partial remission: Secondary | ICD-10-CM | POA: Diagnosis not present

## 2023-05-05 DIAGNOSIS — F4310 Post-traumatic stress disorder, unspecified: Secondary | ICD-10-CM

## 2023-05-05 DIAGNOSIS — F209 Schizophrenia, unspecified: Secondary | ICD-10-CM | POA: Diagnosis not present

## 2023-05-05 MED ORDER — TRAZODONE HCL 100 MG PO TABS: 200.0000 mg | ORAL_TABLET | Freq: Every evening | ORAL | 5 refills | Status: DC | PRN

## 2023-07-21 ENCOUNTER — Encounter: Payer: 59 | Admitting: Nurse Practitioner

## 2023-07-21 DIAGNOSIS — E6609 Other obesity due to excess calories: Secondary | ICD-10-CM

## 2023-07-21 DIAGNOSIS — F4310 Post-traumatic stress disorder, unspecified: Secondary | ICD-10-CM

## 2023-07-21 DIAGNOSIS — F203 Undifferentiated schizophrenia: Secondary | ICD-10-CM

## 2023-07-21 DIAGNOSIS — Z1231 Encounter for screening mammogram for malignant neoplasm of breast: Secondary | ICD-10-CM

## 2023-07-21 DIAGNOSIS — E782 Mixed hyperlipidemia: Secondary | ICD-10-CM

## 2023-07-21 DIAGNOSIS — R030 Elevated blood-pressure reading, without diagnosis of hypertension: Secondary | ICD-10-CM

## 2023-07-21 DIAGNOSIS — E559 Vitamin D deficiency, unspecified: Secondary | ICD-10-CM

## 2023-07-21 DIAGNOSIS — R7301 Impaired fasting glucose: Secondary | ICD-10-CM

## 2023-07-21 DIAGNOSIS — Z0000 Encounter for general adult medical examination without abnormal findings: Secondary | ICD-10-CM

## 2023-07-24 ENCOUNTER — Other Ambulatory Visit: Payer: Self-pay | Admitting: Psychiatry

## 2023-07-27 NOTE — Patient Instructions (Signed)
Please call to schedule your mammogram and/or bone density: [facility]  Address: [address], [place], [place] [address] Phone: [phone]  [facility] [address] [place],  [place]  [address] Phone: [phone]   Prediabetes Eating Plan Prediabetes is a condition that causes blood sugar (glucose) levels to be higher than normal. This increases the risk for developing type 2 diabetes (type 2 diabetes mellitus). Working with a health care provider or nutrition specialist (dietitian) to make diet and lifestyle changes can help prevent the onset of diabetes. These changes may help you: Control your blood glucose levels. Improve your cholesterol levels. Manage your blood pressure. What are tips for following this plan? Reading food labels Read food labels to check the amount of fat, salt (sodium), and sugar in prepackaged foods. Avoid foods that have: Saturated fats. Trans fats. Added sugars. Avoid foods that have more than 300 milligrams (mg) of sodium per serving. Limit your sodium intake to less than 2,300 mg each day. Shopping Avoid buying pre-made and processed foods. Avoid buying drinks with added sugar. Cooking Cook with olive oil. Do not use butter, lard, or ghee. Bake, broil, grill, steam, or boil foods. Avoid frying. Meal planning  Work with your dietitian to create an eating plan that is right for you. This may include tracking how many calories you take in each day. Use a food diary, notebook, or mobile application to track what you eat at each meal. Consider following a Mediterranean diet. This includes: Eating several servings of fresh fruits and vegetables each day. Eating fish at least twice a week. Eating one serving each day of whole grains, beans, nuts, and seeds. Using olive oil instead of other fats. Limiting alcohol. Limiting red meat. Using nonfat or low-fat dairy  products. Consider following a plant-based diet. This includes dietary choices that focus on eating mostly vegetables and fruit, grains, beans, nuts, and seeds. If you have high blood pressure, you may need to limit your sodium intake or follow a diet such as the DASH (Dietary Approaches to Stop Hypertension) eating plan. The DASH diet aims to lower high blood pressure. Lifestyle Set weight loss goals with help from your health care team. It is recommended that most people with prediabetes lose 7% of their body weight. Exercise for at least 30 minutes 5 or more days a week. Attend a support group or seek support from a mental health counselor. Take over-the-counter and prescription medicines only as told by your health care provider. What foods are recommended? Fruits Berries. Bananas. Apples. Oranges. Grapes. Papaya. Mango. Pomegranate. Kiwi. Grapefruit. Cherries. Vegetables Lettuce. Spinach. Peas. Beets. Cauliflower. Cabbage. Broccoli. Carrots. Tomatoes. Squash. Eggplant. Herbs. Peppers. Onions. Cucumbers. Brussels sprouts. Grains Whole grains, such as whole-wheat or whole-grain breads, crackers, cereals, and pasta. Unsweetened oatmeal. Bulgur. Barley. Quinoa. Brown rice. Corn or whole-wheat flour tortillas or taco shells. Meats and other proteins Seafood. Poultry without skin. Lean cuts of pork and beef. Tofu. Eggs. Nuts. Beans. Dairy Low-fat or fat-free dairy products, such as yogurt, cottage cheese, and cheese. Beverages Water. Tea. Coffee. Sugar-free or diet soda. Seltzer water. Low-fat or nonfat milk. Milk alternatives, such as soy or almond milk. Fats and oils Olive oil. Canola oil. Sunflower oil. Grapeseed oil. Avocado. Walnuts. Sweets and desserts Sugar-free or low-fat pudding. Sugar-free or low-fat ice cream and other frozen treats. Seasonings and condiments Herbs. Sodium-free spices. Mustard. Relish. Low-salt, low-sugar ketchup. Low-salt, low-sugar barbecue sauce. Low-fat or  fat-free mayonnaise. The items listed above may not be a complete list of recommended foods and beverages. Contact a dietitian for more information. What foods are not recommended? Fruits Fruits canned with syrup. Vegetables Canned vegetables. Frozen vegetables with butter or cream sauce. Grains Refined white flour and flour products, such as bread, pasta, snack foods, and cereals. Meats and other proteins Fatty cuts of meat. Poultry with skin. Breaded or fried meat. Processed meats. Dairy Full-fat yogurt, cheese, or milk. Beverages Sweetened drinks, such as iced tea and soda. Fats and oils Butter. Lard. Ghee. Sweets and desserts Baked goods, such as cake, cupcakes, pastries, cookies, and cheesecake. Seasonings and condiments Spice mixes with added salt. Ketchup. Barbecue sauce. Mayonnaise. The items listed above may not be a complete list of foods and beverages that are not recommended. Contact a dietitian for more information. Where to find more information American Diabetes Association: www.diabetes.org Summary You may need to make diet and lifestyle changes to help prevent the onset of diabetes. These changes can help you control blood sugar, improve cholesterol levels, and manage blood pressure. Set weight loss goals with help from your health care team. It is recommended that most people with prediabetes lose 7% of their body weight. Consider following a Mediterranean diet. This includes eating plenty of fresh fruits and vegetables, whole grains, beans, nuts, seeds, fish, and low-fat dairy, and using olive oil instead of other fats. This information is not intended to replace advice given to you by your health care provider. Make sure you discuss any questions you have with your health care provider. Document Revised: 03/16/2020 Document Reviewed: 03/16/2020 Elsevier Patient Education  2024 [employer].

## 2023-07-30 ENCOUNTER — Encounter: Payer: Self-pay | Admitting: Nurse Practitioner

## 2023-07-30 ENCOUNTER — Ambulatory Visit (INDEPENDENT_AMBULATORY_CARE_PROVIDER_SITE_OTHER): Payer: 59 | Admitting: Nurse Practitioner

## 2023-07-30 VITALS — BP 115/71 | HR 94 | Temp 98.7°F | Ht 63.2 in | Wt 173.0 lb

## 2023-07-30 DIAGNOSIS — R030 Elevated blood-pressure reading, without diagnosis of hypertension: Secondary | ICD-10-CM

## 2023-07-30 DIAGNOSIS — Z1211 Encounter for screening for malignant neoplasm of colon: Secondary | ICD-10-CM

## 2023-07-30 DIAGNOSIS — Z1231 Encounter for screening mammogram for malignant neoplasm of breast: Secondary | ICD-10-CM

## 2023-07-30 DIAGNOSIS — Z0000 Encounter for general adult medical examination without abnormal findings: Secondary | ICD-10-CM | POA: Diagnosis not present

## 2023-07-30 DIAGNOSIS — E559 Vitamin D deficiency, unspecified: Secondary | ICD-10-CM

## 2023-07-30 DIAGNOSIS — E66811 Obesity, class 1: Secondary | ICD-10-CM

## 2023-07-30 DIAGNOSIS — F203 Undifferentiated schizophrenia: Secondary | ICD-10-CM

## 2023-07-30 DIAGNOSIS — E782 Mixed hyperlipidemia: Secondary | ICD-10-CM

## 2023-07-30 DIAGNOSIS — R7301 Impaired fasting glucose: Secondary | ICD-10-CM | POA: Diagnosis not present

## 2023-07-30 DIAGNOSIS — F4310 Post-traumatic stress disorder, unspecified: Secondary | ICD-10-CM | POA: Diagnosis not present

## 2023-07-30 DIAGNOSIS — Z6831 Body mass index (BMI) 31.0-31.9, adult: Secondary | ICD-10-CM

## 2023-07-30 DIAGNOSIS — E6609 Other obesity due to excess calories: Secondary | ICD-10-CM

## 2023-07-30 LAB — BAYER DCA HB A1C WAIVED: HB A1C (BAYER DCA - WAIVED): 6.1 % — ABNORMAL HIGH (ref 4.8–5.6)

## 2023-07-30 LAB — MICROALBUMIN, URINE WAIVED
Creatinine, Urine Waived: 300 mg/dL (ref 10–300)
Microalb, Ur Waived: 80 mg/L — ABNORMAL HIGH (ref 0–19)

## 2023-07-30 MED ORDER — VITAMIN D (ERGOCALCIFEROL) 1.25 MG (50000 UNIT) PO CAPS: 50000.0000 [IU] | ORAL_CAPSULE | ORAL | 4 refills | Status: DC

## 2023-07-30 NOTE — Assessment & Plan Note (Signed)
Chronic, ongoing.  She denies SI/HI.  Continue collaboration with psychiatry and current medication regimen.   

## 2023-07-30 NOTE — Assessment & Plan Note (Signed)
At goal in office today.  She stopped Losartan in past due to dizziness.  Recommend continue diet focus, with DASH diet.  Check BP at home 3 days a week and report if consistent above 130/80.  If need to start medication consider Lisinopril 2.5 MG or 5 MG, low dose to start.  Check CMP, CBC, TSH, urine ALB today.  Urine ALB 80 July 2024. Return in 6 months.

## 2023-07-30 NOTE — Progress Notes (Signed)
BP 115/71   Pulse 94   Temp 98.7 F (37.1 C) (Oral)   Ht 5' 3.2" (1.605 m)   Wt 173 lb (78.5 kg)   LMP 03/28/2016   SpO2 96%   BMI 30.45 kg/m    Subjective:    Patient ID: Megan [name], female    DOB: [birth year], 62 y.o.   MRN: [id number]  HPI: RUA LUCUS is a 62 y.o. female presenting on 07/30/2023 for comprehensive medical examination. Current medical complaints include: none  She currently lives with: self Menopausal Symptoms: yes  PREDIABETES Recent A1c January 6.3%. Polydipsia/polyuria: no Visual disturbance: no Chest pain: no Paresthesias: no  HYPERTENSION No current medications.  Vitamin D level remains low on past labs, would like to return to weekly supplement. Has lost almost 10 pounds since last visit. Hypertension status: controlled  Satisfied with current treatment? yes Duration of hypertension: years BP monitoring frequency:  not checking BP range:  BP medication side effects:  no Medication compliance:  no current medication regimen Aspirin: no Recurrent headaches: no Visual changes: no Palpitations: no Dyspnea: no Chest pain: no Lower extremity edema: no Dizzy/lightheaded: no  The 10-year ASCVD risk score (Arnett DK, et al., 2019) is: 12%   Values used to calculate the score:     Age: 38 years     Sex: Female     Is Non-Hispanic African American: Yes     Diabetic: Yes     Tobacco smoker: No     Systolic Blood Pressure: 115 mmHg     Is BP treated: No     HDL Cholesterol: 69 mg/dL     Total Cholesterol: 252 mg/dL   SCHIZOPHRENIA & PTSD Sees Dr. Vanetta Shawl, last visit on 05/05/23 -- needs Prolactin level and CMP today for them. Taking Trazodone 200 MG nightly, Effexor 150 MG daily, Invega 6 MG QHS.   Mood status: stable Satisfied with current treatment?: yes Symptom severity: mild  Duration of current treatment : years Side effects: no Medication compliance: good compliance Psychotherapy/counseling:  in the past Depressed mood:  yes Anxious mood: yes Anhedonia: yes Significant weight loss or gain: no Insomnia:  hard to fall asleep without Trazodone Fatigue: no Feelings of worthlessness or guilt: no Impaired concentration/indecisiveness: no Suicidal ideations: no Hopelessness: no Crying spells: yes    07/30/2023    3:28 PM 05/05/2023    4:37 PM 01/20/2023    9:05 AM 12/11/2022    3:03 PM 12/02/2022   12:12 PM  Depression screen PHQ 2/9  Decreased Interest 0 0 0 0 1  Down, Depressed, Hopeless 0 0 0 0 1  PHQ - 2 Score 0 0 0 0 2  Altered sleeping 0  0 0 2  Tired, decreased energy 0  0 0   Change in appetite 0  0 0 1  Feeling bad or failure about yourself  0  0 0 1  Trouble concentrating 0  0 0 2  Moving slowly or fidgety/restless 0  0 0 1  Suicidal thoughts 0  0 0 0  PHQ-9 Score 0  0 0 9  Difficult doing work/chores Not difficult at all  Not difficult at all Not difficult at all        07/30/2023    3:28 PM 01/20/2023    9:05 AM 12/11/2022    3:03 PM 12/02/2022   12:13 PM  GAD 7 : Generalized Anxiety Score  Nervous, Anxious, on Edge 0 0 0 2  Control/stop worrying 0 0  0 1  Worry too much - different things 0 0 0 2  Trouble relaxing 0 0 0 2  Restless 0 0 0 1  Easily annoyed or irritable 0 0 0 1  Afraid - awful might happen 0 0 0 2  Total GAD 7 Score 0 0 0 11  Anxiety Difficulty Not difficult at all Not difficult at all Not difficult at all Somewhat difficult      07/19/2022    1:25 PM 09/04/2022    9:03 AM 12/11/2022    3:03 PM 01/20/2023    9:05 AM 07/30/2023    3:28 PM  Fall Risk  Falls in the past year? 1 1 0 0 0  Was there an injury with Fall? 0 0 0 0 0  Fall Risk Category Calculator 2 1 0 0 0  Fall Risk Category (Retired) Moderate Low Low    (RETIRED) Patient Fall Risk Level Low fall risk Low fall risk     Patient at Risk for Falls Due to History of fall(s)  No Fall Risks No Fall Risks No Fall Risks  Fall risk Follow up Falls evaluation completed Falls evaluation completed;Education  provided;Falls prevention discussed Falls evaluation completed Falls evaluation completed Falls evaluation completed    Past Medical History:  Past Medical History:  Diagnosis Date   Anxiety    Depression    Diabetes mellitus without complication (HCC)     Surgical History:  Past Surgical History:  Procedure Laterality Date   CESAREAN SECTION      Medications:  Current Outpatient Medications on File Prior to Visit  Medication Sig   cyanocobalamin 1000 MCG tablet Take 1,000 mcg by mouth daily.   meloxicam (MOBIC) 7.5 MG tablet TAKE 1 TABLET BY MOUTH DAILY   paliperidone (INVEGA) 6 MG 24 hr tablet Take 1 tablet (6 mg total) by mouth at bedtime.   traZODone (DESYREL) 100 MG tablet Take 2 tablets (200 mg total) by mouth at bedtime as needed for sleep.   venlafaxine XR (EFFEXOR-XR) 150 MG 24 hr capsule Take 1 capsule (150 mg total) by mouth daily with breakfast.   No current facility-administered medications on file prior to visit.    Allergies:  Allergies  Allergen Reactions   Sulfa Antibiotics Rash    Social History:  Social History   Socioeconomic History   Marital status: Divorced    Spouse name: Not on file   Number of children: 2   Years of education: Not on file   Highest education level: 11th grade  Occupational History   Not on file  Tobacco Use   Smoking status: Never   Smokeless tobacco: Former  Building services engineer status: Never Used  Substance and Sexual Activity   Alcohol use: Not Currently    Alcohol/week: 8.0 standard drinks of alcohol    Types: 3 Cans of beer, 5 Shots of liquor per week   Drug use: Not Currently    Types: Marijuana    Comment: has quit   Sexual activity: Yes  Other Topics Concern   Not on file  Social History Narrative   Not on file   Social Determinants of Health   Financial Resource Strain: Low Risk  (09/04/2022)   Overall Financial Resource Strain (CARDIA)    Difficulty of Paying Living Expenses: Not hard at all  Food  Insecurity: No Food Insecurity (09/04/2022)   Hunger Vital Sign    Worried About Running Out of Food in the Last Year: Never true  Ran Out of Food in the Last Year: Never true  Transportation Needs: No Transportation Needs (09/04/2022)   PRAPARE - Administrator, Civil Service (Medical): No    Lack of Transportation (Non-Medical): No  Physical Activity: Inactive (09/04/2022)   Exercise Vital Sign    Days of Exercise per Week: 0 days    Minutes of Exercise per Session: 0 min  Stress: Stress Concern Present (09/04/2022)   [employer] of Occupational Health - Occupational Stress Questionnaire    Feeling of Stress : To some extent  Social Connections: Moderately Isolated (09/04/2022)   Social Connection and Isolation Panel [NHANES]    Frequency of Communication with Friends and Family: More than three times a week    Frequency of Social Gatherings with Friends and Family: More than three times a week    Attends Religious Services: More than 4 times per year    Active Member of [employer] or Organizations: No    Attends Banker Meetings: Never    Marital Status: Divorced  Catering manager Violence: Not At Risk (09/04/2022)   Humiliation, Afraid, Rape, and Kick questionnaire    Fear of Current or Ex-Partner: No    Emotionally Abused: No    Physically Abused: No    Sexually Abused: No   Social History   Tobacco Use  Smoking Status Never  Smokeless Tobacco Former   Social History   Substance and Sexual Activity  Alcohol Use Not Currently   Alcohol/week: 8.0 standard drinks of alcohol   Types: 3 Cans of beer, 5 Shots of liquor per week    Family History:  Family History  Problem Relation Age of Onset   Alzheimer's disease Mother    Dementia Sister    Dementia Sister    Cancer Sister    COPD Sister    Cancer Sister    Lupus Sister    Cancer Sister    Cancer Brother    Diabetes Maternal Grandmother     Past medical history, surgical history, medications,  allergies, family history and social history reviewed with patient today and changes made to appropriate areas of the chart.   ROS All other ROS negative except what is listed above and in the HPI.      Objective:    BP 115/71   Pulse 94   Temp 98.7 F (37.1 C) (Oral)   Ht 5' 3.2" (1.605 m)   Wt 173 lb (78.5 kg)   LMP 03/28/2016   SpO2 96%   BMI 30.45 kg/m   Wt Readings from Last 3 Encounters:  07/30/23 173 lb (78.5 kg)  05/05/23 180 lb 12.8 oz (82 kg)  02/10/23 181 lb 3.2 oz (82.2 kg)    Physical Exam Vitals and nursing note reviewed. Exam conducted with a chaperone present.  Constitutional:      General: She is awake. She is not in acute distress.    Appearance: She is well-developed and well-groomed. She is not ill-appearing or toxic-appearing.  HENT:     Head: Normocephalic and atraumatic.     Right Ear: Hearing, tympanic membrane, ear canal and external ear normal. No drainage.     Left Ear: Hearing, tympanic membrane, ear canal and external ear normal. No drainage.     Nose: Nose normal.     Right Sinus: No maxillary sinus tenderness or frontal sinus tenderness.     Left Sinus: No maxillary sinus tenderness or frontal sinus tenderness.     Mouth/Throat:  Mouth: Mucous membranes are moist.     Pharynx: Oropharynx is clear. Uvula midline. No pharyngeal swelling, oropharyngeal exudate or posterior oropharyngeal erythema.  Eyes:     General: Lids are normal.        Right eye: No discharge.        Left eye: No discharge.     Extraocular Movements: Extraocular movements intact.     Conjunctiva/sclera: Conjunctivae normal.     Pupils: Pupils are equal, round, and reactive to light.     Visual Fields: Right eye visual fields normal and left eye visual fields normal.  Neck:     Thyroid: No thyromegaly.     Vascular: No carotid bruit.     Trachea: Trachea normal.  Cardiovascular:     Rate and Rhythm: Normal rate and regular rhythm.     Heart sounds: Normal heart  sounds. No murmur heard.    No gallop.  Pulmonary:     Effort: Pulmonary effort is normal. No accessory muscle usage or respiratory distress.     Breath sounds: Normal breath sounds.  Chest:  Breasts:    Right: Normal.     Left: Normal.  Abdominal:     General: Bowel sounds are normal.     Palpations: Abdomen is soft. There is no hepatomegaly or splenomegaly.     Tenderness: There is no abdominal tenderness.  Musculoskeletal:        General: Normal range of motion.     Cervical back: Normal range of motion and neck supple.     Right lower leg: No edema.     Left lower leg: No edema.  Lymphadenopathy:     Head:     Right side of head: No submental, submandibular, tonsillar, preauricular or posterior auricular adenopathy.     Left side of head: No submental, submandibular, tonsillar, preauricular or posterior auricular adenopathy.     Cervical: No cervical adenopathy.     Upper Body:     Right upper body: No supraclavicular, axillary or pectoral adenopathy.     Left upper body: No supraclavicular, axillary or pectoral adenopathy.  Skin:    General: Skin is warm and dry.     Capillary Refill: Capillary refill takes less than 2 seconds.     Findings: No rash.  Neurological:     Mental Status: She is alert and oriented to person, place, and time.     Gait: Gait is intact.     Deep Tendon Reflexes: Reflexes are normal and symmetric.     Reflex Scores:      Brachioradialis reflexes are 2+ on the right side and 2+ on the left side.      Patellar reflexes are 2+ on the right side and 2+ on the left side. Psychiatric:        Attention and Perception: Attention normal.        Mood and Affect: Mood normal.        Speech: Speech normal.        Behavior: Behavior normal. Behavior is cooperative.        Thought Content: Thought content normal.        Judgment: Judgment normal.    Results for orders placed or performed in visit on 01/20/23  HgB A1c  Result Value Ref Range   Hgb A1c  MFr Bld 6.3 (H) 4.8 - 5.6 %   Est. average glucose Bld gHb Est-mCnc 134 mg/dL  Comprehensive metabolic panel  Result Value Ref Range   Glucose  115 (H) 70 - 99 mg/dL   BUN 16 8 - 27 mg/dL   Creatinine, Ser 5.62 0.57 - 1.00 mg/dL   eGFR 76 >13 YQ/MVH/8.46   BUN/Creatinine Ratio 19 12 - 28   Sodium 142 134 - 144 mmol/L   Potassium 4.2 3.5 - 5.2 mmol/L   Chloride 105 96 - 106 mmol/L   CO2 22 20 - 29 mmol/L   Calcium 8.9 8.7 - 10.3 mg/dL   Total Protein 6.3 6.0 - 8.5 g/dL   Albumin 4.2 3.9 - 4.9 g/dL   Globulin, Total 2.1 1.5 - 4.5 g/dL   Albumin/Globulin Ratio 2.0 1.2 - 2.2   Bilirubin Total <0.2 0.0 - 1.2 mg/dL   Alkaline Phosphatase 47 44 - 121 IU/L   AST 42 (H) 0 - 40 IU/L   ALT 41 (H) 0 - 32 IU/L  Lipid Panel w/o Chol/HDL Ratio  Result Value Ref Range   Cholesterol, Total 252 (H) 100 - 199 mg/dL   Triglycerides 962 (H) 0 - 149 mg/dL   HDL 69 >95 mg/dL   VLDL Cholesterol Cal 51 (H) 5 - 40 mg/dL   LDL Chol Calc (NIH) 284 (H) 0 - 99 mg/dL  VITAMIN D 25 Hydroxy (Vit-D Deficiency, Fractures)  Result Value Ref Range   Vit D, 25-Hydroxy 30.4 30.0 - 100.0 ng/mL      Assessment & Plan:   Problem List Items Addressed This Visit       Endocrine   IFG (impaired fasting glucose)    A1c trending down from 6.3% to 6.1% today.  She has lost 10 pounds and is focused on diet and regular walking.  Continue this and monitor closely.      Relevant Orders   Bayer DCA Hb A1c Waived   Microalbumin, Urine Waived     Other   Elevated blood pressure reading    At goal in office today.  She stopped Losartan in past due to dizziness.  Recommend continue diet focus, with DASH diet.  Check BP at home 3 days a week and report if consistent above 130/80.  If need to start medication consider Lisinopril 2.5 MG or 5 MG, low dose to start.  Check CMP, CBC, TSH, urine ALB today.  Urine ALB 80 July 2024. Return in 6 months.      Relevant Orders   CBC with Differential/Platelet   TSH    Hyperlipidemia    History of elevation, she reports she is fasting today.  Sister with history of CVA, discussed her current ASCVD 12% with her and recommend medication, has refused at visits.  Continue to recommend statin use.      Relevant Orders   Comprehensive metabolic panel   Lipid Panel w/o Chol/HDL Ratio   Obesity    BMI 30.45.  Recommended eating smaller high protein, low fat meals more frequently and exercising 30 mins a day 5 times a week with a goal of 10-15lb weight loss in the next 3 months. Patient voiced their understanding and motivation to adhere to these recommendations.       PTSD (post-traumatic stress disorder)    Chronic, ongoing.  She denies SI/HI.  Continue collaboration with psychiatry and current medication regimen.        Schizophrenia, undifferentiated (HCC) - Primary    Chronic, ongoing. Continue collaboration with psychiatry and current medication regimen as prescribed by them.  She denies SI/HI.  Recent notes reviewed.  Will order Prolactin level for them.      Relevant  Orders   TSH   Prolactin   Vitamin D deficiency    Chronic, ongoing.  Recommend continue supplement weekly and recheck level today.      Relevant Orders   VITAMIN D 25 Hydroxy (Vit-D Deficiency, Fractures)   Other Visit Diagnoses     Encounter for screening mammogram for malignant neoplasm of breast       Mammogram ordered today and discussed how to schedule.   Relevant Orders   MM 3D SCREENING MAMMOGRAM BILATERAL BREAST   Screening for colon cancer       Cologuard ordered per request.   Relevant Orders   Cologuard   Encounter for annual physical exam       Annual physical today with labs and health maintenance reviewed, discussed with patient.        Follow up plan: Return in about 6 months (around 01/30/2024) for MOOD, HTN, PREDIABETES.   LABORATORY TESTING:  - Pap smear: pap done  IMMUNIZATIONS:   - Tdap: Tetanus vaccination status reviewed: Td vaccination  indicated and given today. - Influenza: Up to date - Pneumovax: Not applicable - Prevnar: Not applicable - COVID: Up to date - HPV: Not applicable - Shingrix vaccine: Refuses  SCREENING: -Mammogram: Ordered today  - Colonoscopy: Ordered today Cologuard ordered - Bone Density:  Not applicable   -Hearing Test: Not applicable  -Spirometry: Not applicable   PATIENT COUNSELING:   Advised to take 1 mg of folate supplement per day if capable of pregnancy.   Sexuality: Discussed sexually transmitted diseases, partner selection, use of condoms, avoidance of unintended pregnancy  and contraceptive alternatives.   Advised to avoid cigarette smoking.  I discussed with the patient that most people either abstain from alcohol or drink within safe limits (<=14/week and <=4 drinks/occasion for males, <=7/weeks and <= 3 drinks/occasion for females) and that the risk for alcohol disorders and other health effects rises proportionally with the number of drinks per week and how often a drinker exceeds daily limits.  Discussed cessation/primary prevention of drug use and availability of treatment for abuse.   Diet: Encouraged to adjust caloric intake to maintain  or achieve ideal body weight, to reduce intake of dietary saturated fat and total fat, to limit sodium intake by avoiding high sodium foods and not adding table salt, and to maintain adequate dietary potassium and calcium preferably from fresh fruits, vegetables, and low-fat dairy products.    Stressed the importance of regular exercise  Injury prevention: Discussed safety belts, safety helmets, smoke detector, smoking near bedding or upholstery.   Dental health: Discussed importance of regular tooth brushing, flossing, and dental visits.    NEXT PREVENTATIVE PHYSICAL DUE IN 1 YEAR. Return in about 6 months (around 01/30/2024) for MOOD, HTN, PREDIABETES.

## 2023-07-30 NOTE — Assessment & Plan Note (Signed)
Chronic, ongoing. Continue collaboration with psychiatry and current medication regimen as prescribed by them.  She denies SI/HI.  Recent notes reviewed.  Will order Prolactin level for them.

## 2023-07-30 NOTE — Assessment & Plan Note (Signed)
History of elevation, she reports she is fasting today.  Sister with history of CVA, discussed her current ASCVD 12% with her and recommend medication, has refused at visits.  Continue to recommend statin use.

## 2023-07-30 NOTE — Assessment & Plan Note (Signed)
A1c trending down from 6.3% to 6.1% today.  She has lost 10 pounds and is focused on diet and regular walking.  Continue this and monitor closely.

## 2023-07-30 NOTE — Assessment & Plan Note (Signed)
BMI 30.45.  Recommended eating smaller high protein, low fat meals more frequently and exercising 30 mins a day 5 times a week with a goal of 10-15lb weight loss in the next 3 months. Patient voiced their understanding and motivation to adhere to these recommendations.  

## 2023-07-30 NOTE — Assessment & Plan Note (Signed)
Chronic, ongoing.  Recommend continue supplement weekly and recheck level today.

## 2023-07-31 ENCOUNTER — Other Ambulatory Visit: Payer: Self-pay | Admitting: Nurse Practitioner

## 2023-07-31 DIAGNOSIS — R7989 Other specified abnormal findings of blood chemistry: Secondary | ICD-10-CM

## 2023-07-31 MED ORDER — ROSUVASTATIN CALCIUM 10 MG PO TABS: 10.0000 mg | ORAL_TABLET | Freq: Every day | ORAL | 4 refills | Status: DC

## 2023-07-31 NOTE — Progress Notes (Signed)
Called patient to inform of lab results she stated that someone had called her and she will be going to see her PCP soon

## 2023-07-31 NOTE — Progress Notes (Signed)
Thank you for notifying me. I will instruct the nurse to contact her to maintain the current dose of Invega and continue monitoring her levels.

## 2023-07-31 NOTE — Progress Notes (Signed)
I obtained Prolactin level for you, it has trended up some.  Is attached here.

## 2023-07-31 NOTE — Progress Notes (Signed)
Good morning, please let Enijah know her labs have returned + we will need repeat lab visit in 4 weeks: - Vitamin D remains a little low, restart your Vitamin D supplement as ordered. - Cholesterol labs are trending up.  I am sending in a low dose of Rosuvastatin to start taking daily to help prevent stroke or heart attack, as your current risk scoring for this is a little elevated.  If any side effects with this let me know. - Thyroid lab is a little low, more on overactive side.  I would like to recheck this outpatient in 4 weeks and if trends down we may need to send you to endocrinology for further check on thyroid. - Kidney and liver function are normal + CBC shows no anemia. - Prolactin level a little elevated, I will send this to your psychiatrist for review and recommendations.  Any questions? Keep being amazing!!  Thank you for allowing me to participate in your care.  I appreciate you. Kindest regards, Latrese Carolan

## 2023-07-31 NOTE — Progress Notes (Signed)
Could you contact the patient? Her prolactin level was high, likely due to [place]. Despite this, since the medication has been effective, I recommend that she continue with the current dose of Invega unless she has any concerns. We will plan to recheck her prolactin level in a few months.

## 2023-08-04 ENCOUNTER — Ambulatory Visit: Payer: 59 | Admitting: Psychiatry

## 2023-08-11 DIAGNOSIS — Z1211 Encounter for screening for malignant neoplasm of colon: Secondary | ICD-10-CM | POA: Diagnosis not present

## 2023-08-16 NOTE — Progress Notes (Signed)
Contacted via MyChart   Good news Megan [name], your Cologuard returned and is negative:) Repeat in 3 years.

## 2023-08-18 ENCOUNTER — Telehealth: Payer: Self-pay | Admitting: Nurse Practitioner

## 2023-08-18 NOTE — Telephone Encounter (Signed)
Pt. Given Colo guard results, verbalizes understanding.

## 2023-08-27 ENCOUNTER — Other Ambulatory Visit: Payer: 59

## 2023-08-27 ENCOUNTER — Ambulatory Visit
Admission: RE | Admit: 2023-08-27 | Discharge: 2023-08-27 | Disposition: A | Discharge status: Home / Self Care | Payer: 59 | Source: Ambulatory Visit | Attending: Nurse Practitioner | Admitting: Nurse Practitioner

## 2023-08-27 DIAGNOSIS — Z1231 Encounter for screening mammogram for malignant neoplasm of breast: Secondary | ICD-10-CM | POA: Insufficient documentation

## 2023-08-28 ENCOUNTER — Other Ambulatory Visit: Payer: Self-pay | Admitting: Nurse Practitioner

## 2023-08-28 DIAGNOSIS — R928 Other abnormal and inconclusive findings on diagnostic imaging of breast: Secondary | ICD-10-CM
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
## 2023-09-03 ENCOUNTER — Telehealth: Payer: Self-pay | Admitting: Nurse Practitioner

## 2023-09-03 ENCOUNTER — Other Ambulatory Visit: Payer: 59

## 2023-09-03 DIAGNOSIS — R928 Other abnormal and inconclusive findings on diagnostic imaging of breast: Secondary | ICD-10-CM

## 2023-09-03 DIAGNOSIS — R7989 Other specified abnormal findings of blood chemistry: Secondary | ICD-10-CM

## 2023-09-03 NOTE — Telephone Encounter (Signed)
Both forms placed in providers folder for completion.

## 2023-09-03 NOTE — Telephone Encounter (Signed)
Patient dropped off Discharge Application Form from [place] [employer] for completion and Disability Parking Placard for completion as well. I am placing forms in Aura Dials, NP folder. Informed patient to allow provider 48-72 hours for completion. Patient request call when complete [phone]

## 2023-09-04 ENCOUNTER — Ambulatory Visit (INDEPENDENT_AMBULATORY_CARE_PROVIDER_SITE_OTHER): Payer: 59 | Admitting: Psychiatry

## 2023-09-04 ENCOUNTER — Encounter: Payer: Self-pay | Admitting: Psychiatry

## 2023-09-04 VITALS — BP 124/77 | HR 96 | Temp 97.7°F | Ht 63.2 in | Wt 175.6 lb

## 2023-09-04 DIAGNOSIS — F3341 Major depressive disorder, recurrent, in partial remission: Secondary | ICD-10-CM

## 2023-09-04 DIAGNOSIS — F209 Schizophrenia, unspecified: Secondary | ICD-10-CM | POA: Diagnosis not present

## 2023-09-04 DIAGNOSIS — F4310 Post-traumatic stress disorder, unspecified: Secondary | ICD-10-CM

## 2023-09-04 DIAGNOSIS — G4700 Insomnia, unspecified: Secondary | ICD-10-CM | POA: Diagnosis not present

## 2023-09-04 LAB — TSH: TSH: 0.88 u[IU]/mL (ref 0.450–4.500)

## 2023-09-04 LAB — THYROID PEROXIDASE ANTIBODY: Thyroperoxidase Ab SerPl-aCnc: 22 [IU]/mL (ref 0–34)

## 2023-09-04 LAB — T4, FREE: Free T4: 1.28 ng/dL (ref 0.82–1.77)

## 2023-09-04 MED ORDER — VENLAFAXINE HCL ER 150 MG PO CP24: 150.0000 mg | ORAL_CAPSULE | Freq: Every day | ORAL | 5 refills | Status: DC

## 2023-09-04 MED ORDER — PALIPERIDONE ER 1.5 MG PO TB24: 1.5000 mg | ORAL_TABLET | Freq: Every day | ORAL | 2 refills | Status: DC

## 2023-09-04 MED ORDER — PALIPERIDONE ER 3 MG PO TB24: 3.0000 mg | ORAL_TABLET | Freq: Every day | ORAL | 2 refills | Status: DC

## 2023-09-04 NOTE — Progress Notes (Signed)
Good afternoon, please let Wakita know her thyroid labs have returned and are now back to normal range this check.  No medications needed.  We will continue to monitor closely. Keep being amazing!!  Thank you for allowing me to participate in your care.  I appreciate you. Kindest regards, Nakyla Bracco

## 2023-09-04 NOTE — Patient Instructions (Signed)
Decrease Invega 4.5 mg at night  Continue venlafaxine 150 mg daily  Continue trazodone 200 mg at night as needed for insomnia  Next appointment: 12/2 at 4 30

## 2023-09-05 ENCOUNTER — Other Ambulatory Visit: Payer: Self-pay | Admitting: Nurse Practitioner

## 2023-09-05 ENCOUNTER — Ambulatory Visit
Admission: RE | Admit: 2023-09-05 | Discharge: 2023-09-05 | Disposition: A | Discharge status: Home / Self Care | Payer: 59 | Source: Ambulatory Visit | Attending: Nurse Practitioner | Admitting: Nurse Practitioner

## 2023-09-05 DIAGNOSIS — N6011 Diffuse cystic mastopathy of right breast: Secondary | ICD-10-CM | POA: Diagnosis not present

## 2023-09-05 DIAGNOSIS — R928 Other abnormal and inconclusive findings on diagnostic imaging of breast: Secondary | ICD-10-CM

## 2023-09-05 DIAGNOSIS — N63 Unspecified lump in unspecified breast: Secondary | ICD-10-CM

## 2023-09-05 DIAGNOSIS — N6002 Solitary cyst of left breast: Secondary | ICD-10-CM | POA: Diagnosis not present

## 2023-09-05 DIAGNOSIS — N6311 Unspecified lump in the right breast, upper outer quadrant: Secondary | ICD-10-CM | POA: Diagnosis not present

## 2023-09-05 DIAGNOSIS — N6320 Unspecified lump in the left breast, unspecified quadrant: Secondary | ICD-10-CM | POA: Diagnosis not present

## 2023-09-05 DIAGNOSIS — N6012 Diffuse cystic mastopathy of left breast: Secondary | ICD-10-CM | POA: Diagnosis not present

## 2023-09-08 NOTE — Telephone Encounter (Signed)
Forms completed by Jolene. Called and LVM notifying patient that forms were ready to be picked up. Copy placed in scan bin to be scanned into chart.

## 2023-09-11 ENCOUNTER — Ambulatory Visit
Admission: RE | Admit: 2023-09-11 | Discharge: 2023-09-11 | Disposition: A | Discharge status: Home / Self Care | Payer: 59 | Source: Ambulatory Visit | Attending: Nurse Practitioner | Admitting: Nurse Practitioner

## 2023-09-11 DIAGNOSIS — N63 Unspecified lump in unspecified breast: Secondary | ICD-10-CM | POA: Insufficient documentation

## 2023-09-11 DIAGNOSIS — R928 Other abnormal and inconclusive findings on diagnostic imaging of breast: Secondary | ICD-10-CM | POA: Insufficient documentation

## 2023-09-11 HISTORY — PX: BREAST BIOPSY: SHX20

## 2023-09-11 MED ORDER — LIDOCAINE 1 % OPTIME INJ - NO CHARGE
10.0000 mL | Freq: Once | INTRAMUSCULAR | Status: AC
  Administered 2023-09-11: 10 mL
  Filled 2023-09-11: qty 10

## 2023-09-12 LAB — SURGICAL PATHOLOGY
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
## 2023-12-01 ENCOUNTER — Ambulatory Visit (INDEPENDENT_AMBULATORY_CARE_PROVIDER_SITE_OTHER): Payer: 59 | Admitting: Psychiatry

## 2023-12-01 ENCOUNTER — Encounter: Payer: Self-pay | Admitting: Psychiatry

## 2023-12-01 ENCOUNTER — Other Ambulatory Visit: Payer: Self-pay | Admitting: Psychiatry

## 2023-12-01 VITALS — BP 133/82 | HR 87 | Temp 97.7°F | Ht 63.2 in | Wt 181.8 lb

## 2023-12-01 DIAGNOSIS — G4700 Insomnia, unspecified: Secondary | ICD-10-CM | POA: Diagnosis not present

## 2023-12-01 DIAGNOSIS — F209 Schizophrenia, unspecified: Secondary | ICD-10-CM

## 2023-12-01 DIAGNOSIS — F3342 Major depressive disorder, recurrent, in full remission: Secondary | ICD-10-CM

## 2023-12-01 DIAGNOSIS — F4310 Post-traumatic stress disorder, unspecified: Secondary | ICD-10-CM | POA: Diagnosis not present

## 2023-12-01 MED ORDER — PALIPERIDONE ER 3 MG PO TB24: 3.0000 mg | ORAL_TABLET | Freq: Every day | ORAL | 3 refills | Status: DC

## 2023-12-01 MED ORDER — TRAZODONE HCL 100 MG PO TABS: 200.0000 mg | ORAL_TABLET | Freq: Every evening | ORAL | 3 refills | Status: DC | PRN

## 2023-12-01 MED ORDER — PALIPERIDONE ER 1.5 MG PO TB24: 1.5000 mg | ORAL_TABLET | Freq: Every day | ORAL | 3 refills | Status: DC

## 2023-12-15 NOTE — Telephone Encounter (Signed)
Copied from CRM [id number]. Topic: Medicare AWV >> Dec 15, 2023  1:56 PM Payton Doughty wrote: Reason for CRM: Called LVM 12/15/2023 to schedule Annual Wellness Visit  Verlee Rossetti; Care Guide Ambulatory Clinical Support Gideon l [facility] Direct Dial: [phone]

## 2024-01-01 ENCOUNTER — Ambulatory Visit: Payer: 59 | Admitting: Emergency Medicine

## 2024-01-01 VITALS — Ht 63.0 in | Wt 180.0 lb

## 2024-01-01 DIAGNOSIS — Z0000 Encounter for general adult medical examination without abnormal findings: Secondary | ICD-10-CM | POA: Diagnosis not present

## 2024-01-01 NOTE — Patient Instructions (Addendum)
 [name] , Thank you for taking time to come for your Medicare Wellness Visit. I appreciate your ongoing commitment to your health goals. Please review the following plan we discussed and let me know if I can assist you in the future.   Referrals/Orders/Follow-Ups/Clinician Recommendations: Get the covid and shingles vaccines at your convenience.  This is a list of the screening recommended for you and due dates:  Health Maintenance  Topic Date Due   Zoster (Shingles) Vaccine (1 of 2) Never done   COVID-19 Vaccine (3 - Pfizer risk series) 06/13/2020   Flu Shot  03/29/2024*   Hemoglobin A1C  01/30/2024   Yearly kidney function blood test for diabetes  07/29/2024   Yearly kidney health urinalysis for diabetes  07/29/2024   Mammogram  09/04/2024   Medicare Annual Wellness Visit  12/31/2024   Cologuard (Stool DNA test)  08/10/2026   Pap with HPV screening  07/20/2027   DTaP/Tdap/Td vaccine (2 - Tdap) 07/19/2032   Hepatitis C Screening  Completed   HIV Screening  Completed   HPV Vaccine  Aged Out   Complete foot exam   Discontinued   Eye exam for diabetics  Discontinued  *Topic was postponed. The date shown is not the original due date.    Advanced directives: (Declined) Advance directive discussed with you today. Even though you declined this today, please call our office should you change your mind, and we can give you the proper paperwork for you to fill out.  Next Medicare Annual Wellness Visit scheduled for next year: Yes, 01/14/24 @ 10:00am  There are several Eye Doctors in your area. Here are a few that usually accept all insurance types:  [facility] [address] [place], [place] [address] Phone: [phone]  [facility] [address] [place], [place] [address] Phone: [phone]  [facility] [address] [place], [place] [address] Phone: [phone]  [facility] [address] [place], [place] [address] Phone: [phone]  [facility] [address] [place], [place] [address] Phone: [phone]  [facility] [address] [place], [place] [address] Phone: [phone]  Please let us  know if you require a referral for an eye exam appointment. Thank you!

## 2024-01-01 NOTE — Progress Notes (Signed)
 Subjective:   Megan [name] is a 63 y.o. female who presents for Medicare Annual (Subsequent) preventive examination.  Interactive audio and video telecommunications were attempted between this provider and patient, however failed, due to patient having technical difficulties OR patient did not have access to video capability.  We continued and completed visit with audio only.  Visit Complete: Virtual I connected with  Megan [name] on 01/01/24 by a audio enabled telemedicine application and verified that I am speaking with the correct person using two identifiers.   Patient Location: Home  Provider Location: Home Office  I discussed the limitations of evaluation and management by telemedicine. The patient expressed understanding and agreed to proceed.  Vital Signs: Because this visit was a virtual/telehealth visit, some criteria may be missing or patient reported. Any vitals not documented were not able to be obtained and vitals that have been documented are patient reported.   Cardiac Risk Factors include: dyslipidemia;obesity (BMI >30kg/m2)     Objective:    Today's Vitals   01/01/24 0958  Weight: 180 lb (81.6 kg)  Height: 5' 3 (1.6 m)   Body mass index is 31.89 kg/m.     01/01/2024   10:12 AM 09/04/2022    9:03 AM 07/16/2021   10:38 AM 07/14/2020    2:33 PM 07/31/2019    4:16 PM 07/05/2016   11:19 AM 06/29/2016    6:04 PM  Advanced Directives  Does Patient Have a Medical Advance Directive? No No No No No    Would patient like information on creating a medical advance directive? No - Patient declined No - Patient declined   No - Patient declined       Information is confidential and restricted. Go to Review Flowsheets to unlock data.     Current Medications (verified) Outpatient Encounter Medications as of 01/01/2024  Medication Sig   cyanocobalamin 1000 MCG tablet Take 1,000 mcg by mouth daily.   meloxicam  (MOBIC ) 7.5 MG tablet TAKE 1 TABLET BY MOUTH DAILY    paliperidone  (INVEGA ) 1.5 MG 24 hr tablet Take 1 tablet (1.5 mg total) by mouth daily. Take total of 4.5 mg daily. Take along with 3 mg tab   paliperidone  (INVEGA ) 3 MG 24 hr tablet Take 1 tablet (3 mg total) by mouth daily. Take total of 4.5 mg daily. Take along with 1.5 mg tab   rosuvastatin  (CRESTOR ) 10 MG tablet Take 1 tablet (10 mg total) by mouth daily.   traZODone  (DESYREL ) 100 MG tablet Take 2 tablets (200 mg total) by mouth at bedtime as needed for sleep.   venlafaxine  XR (EFFEXOR -XR) 150 MG 24 hr capsule Take 1 capsule (150 mg total) by mouth daily with breakfast.   Vitamin D , Ergocalciferol , (DRISDOL ) 1.25 MG (50000 UNIT) CAPS capsule Take 1 capsule (50,000 Units total) by mouth every 7 (seven) days.   No facility-administered encounter medications on file as of 01/01/2024.    Allergies (verified) Sulfa antibiotics   History: Past Medical History:  Diagnosis Date   Anxiety    Depression    Diabetes mellitus without complication [facility])    Past Surgical History:  Procedure Laterality Date   BREAST BIOPSY Right 09/11/2023   u/s bx, mass 9:30, COIL clip-path pending   BREAST BIOPSY Right 09/11/2023   US  RT BREAST BX W LOC DEV 1ST LESION IMG BX SPEC US  GUIDE 09/11/2023 [facility]   CESAREAN SECTION     Family History  Problem Relation Age of Onset   Hypertension Mother  Alzheimer's disease Mother    Other Father        old age in his 20s   Dementia Sister    Dementia Sister    Cancer Sister    COPD Sister    Cancer Sister    Lupus Sister    Cancer Sister    Cancer Brother    Diabetes Maternal Grandmother    Social History   Socioeconomic History   Marital status: Divorced    Spouse name: Not on file   Number of children: 2   Years of education: Not on file   Highest education level: 11th grade  Occupational History   Occupation: disability  Tobacco Use   Smoking status: Never   Smokeless tobacco: Former    Types: Snuff    Quit date: 2015  Vaping  Use   Vaping status: Never Used  Substance and Sexual Activity   Alcohol use: Yes    Alcohol/week: 8.0 standard drinks of alcohol    Types: 3 Cans of beer, 5 Shots of liquor per week    Comment: 2-3 mixed drinks 1-2 times per month and a 6 pack of beer every 2-3 weeks   Drug use: Not Currently    Types: Marijuana    Comment: quit 2023   Sexual activity: Yes  Other Topics Concern   Not on file  Social History Narrative   Divorced   Social Drivers of Health   Financial Resource Strain: Low Risk  (01/01/2024)   Overall Financial Resource Strain (CARDIA)    Difficulty of Paying Living Expenses: Not hard at all  Food Insecurity: No Food Insecurity (01/01/2024)   Hunger Vital Sign    Worried About Running Out of Food in the Last Year: Never true    Ran Out of Food in the Last Year: Never true  Transportation Needs: No Transportation Needs (01/01/2024)   PRAPARE - Administrator, Civil Service (Medical): No    Lack of Transportation (Non-Medical): No  Physical Activity: Inactive (01/01/2024)   Exercise Vital Sign    Days of Exercise per Week: 0 days    Minutes of Exercise per Session: 0 min  Stress: No Stress Concern Present (01/01/2024)   [employer] of Occupational Health - Occupational Stress Questionnaire    Feeling of Stress : Not at all  Social Connections: Moderately Integrated (01/01/2024)   Social Connection and Isolation Panel [NHANES]    Frequency of Communication with Friends and Family: More than three times a week    Frequency of Social Gatherings with Friends and Family: More than three times a week    Attends Religious Services: More than 4 times per year    Active Member of [employer] or Organizations: Yes    Attends Engineer, Structural: More than 4 times per year    Marital Status: Divorced    Tobacco Counseling Counseling given: Not Answered   Clinical Intake:  Pre-visit preparation completed: Yes  Pain : No/denies pain     BMI -  recorded: 31.89 Nutritional Status: BMI > 30  Obese Nutritional Risks: None Diabetes: No  How often do you need to have someone help you when you read instructions, pamphlets, or other written materials from your doctor or pharmacy?: 1 - Never  Interpreter Needed?: No  Information entered by :: Vina Ned, CMA   Activities of Daily Living    01/01/2024   10:00 AM  In your present state of health, do you have any difficulty performing  the following activities:  Hearing? 0  Vision? 0  Difficulty concentrating or making decisions? 0  Walking or climbing stairs? 0  Dressing or bathing? 0  Doing errands, shopping? 0  Preparing Food and eating ? N  Using the Toilet? N  In the past six months, have you accidently leaked urine? N  Do you have problems with loss of bowel control? N  Managing your Medications? N  Managing your Finances? N  Housekeeping or managing your Housekeeping? N    Patient Care Team: Cannady, Jolene T, NP as PCP - General (Nurse Practitioner)  Indicate any recent Medical Services you may have received from other than [facility] providers in the past year (date may be approximate).     Assessment:   This is a routine wellness examination for Alaiza.  Hearing/Vision screen Hearing Screening - Comments:: Denies hearing loss Vision Screening - Comments:: Recommended getting an eye exam   Goals Addressed             This Visit's Progress    Exercise 3x per week (30 min per time)        Depression Screen    01/01/2024   10:09 AM 07/30/2023    3:28 PM 05/05/2023    4:37 PM 01/20/2023    9:05 AM 12/11/2022    3:03 PM 12/02/2022   12:12 PM 09/12/2022   11:02 AM  PHQ 2/9 Scores  PHQ - 2 Score 1 0  0 0    PHQ- 9 Score  0  0 0       Information is confidential and restricted. Go to Review Flowsheets to unlock data.     Fall Risk    01/01/2024   10:13 AM 07/30/2023    3:28 PM 01/20/2023    9:05 AM 12/11/2022    3:03 PM 09/04/2022    9:03 AM  Fall Risk    Falls in the past year? 0 0 0 0 1  Number falls in past yr: 0 0 0 0 0  Injury with Fall? 0 0 0 0 0  Risk for fall due to : No Fall Risks No Fall Risks No Fall Risks No Fall Risks   Follow up Falls prevention discussed Falls evaluation completed Falls evaluation completed Falls evaluation completed Falls evaluation completed;Education provided;Falls prevention discussed    MEDICARE RISK AT HOME: Medicare Risk at Home Any stairs in or around the home?: Yes If so, are there any without handrails?: No Home free of loose throw rugs in walkways, pet beds, electrical cords, etc?: No Adequate lighting in your home to reduce risk of falls?: Yes Life alert?: No Use of a cane, walker or w/c?: No Grab bars in the bathroom?: No Shower chair or bench in shower?: No Elevated toilet seat or a handicapped toilet?: No  TIMED UP AND GO:  Was the test performed?  No    Cognitive Function:        01/01/2024   10:14 AM 09/04/2022    9:04 AM 07/16/2021   10:42 AM 07/14/2020    2:39 PM  6CIT Screen  What Year? 0 points 0 points 0 points 0 points  What month? 0 points 0 points 0 points 0 points  What time? 0 points 0 points 0 points 0 points  Count back from 20 0 points 0 points 0 points 0 points  Months in reverse 2 points 0 points 0 points 0 points  Repeat phrase 0 points 0 points 0 points 0 points  Total  Score 2 points 0 points 0 points 0 points    Immunizations Immunization History  Administered Date(s) Administered   PFIZER(Purple Top)SARS-COV-2 Vaccination 04/25/2020, 05/16/2020   Td 07/19/2022    TDAP status: Up to date  Flu Vaccine status: Declined, Education has been provided regarding the importance of this vaccine but patient still declined. Advised may receive this vaccine at local pharmacy or Health Dept. Aware to provide a copy of the vaccination record if obtained from local pharmacy or Health Dept. Verbalized acceptance and understanding.  Pneumococcal vaccine status: Up to  date  Covid-19 vaccine status: Information provided on how to obtain vaccines.   Qualifies for Shingles Vaccine? Yes   Zostavax completed No   Shingrix  Completed?: No.    Education has been provided regarding the importance of this vaccine. Patient has been advised to call insurance company to determine out of pocket expense if they have not yet received this vaccine. Advised may also receive vaccine at local pharmacy or Health Dept. Verbalized acceptance and understanding.  Screening Tests Health Maintenance  Topic Date Due   Zoster Vaccines- Shingrix  (1 of 2) Never done   COVID-19 Vaccine (3 - Pfizer risk series) 06/13/2020   INFLUENZA VACCINE  Never done   HEMOGLOBIN A1C  01/30/2024   Diabetic kidney evaluation - eGFR measurement  07/29/2024   Diabetic kidney evaluation - Urine ACR  07/29/2024   MAMMOGRAM  09/04/2024   Medicare Annual Wellness (AWV)  12/31/2024   Fecal DNA (Cologuard)  08/10/2026   Cervical Cancer Screening (HPV/Pap Cotest)  07/20/2027   DTaP/Tdap/Td (2 - Tdap) 07/19/2032   Hepatitis C Screening  Completed   HIV Screening  Completed   HPV VACCINES  Aged Out   FOOT EXAM  Discontinued   OPHTHALMOLOGY EXAM  Discontinued    Health Maintenance  Health Maintenance Due  Topic Date Due   Zoster Vaccines- Shingrix  (1 of 2) Never done   COVID-19 Vaccine (3 - Pfizer risk series) 06/13/2020   INFLUENZA VACCINE  Never done    Colorectal cancer screening: Type of screening: Cologuard. Completed 08/11/23. Repeat every 3 years  Mammogram status: Completed 09/05/23. Repeat every year   Lung Cancer Screening: (Low Dose CT Chest recommended if Age 79-80 years, 20 pack-year currently smoking OR have quit w/in 15years.) does not qualify.   Lung Cancer Screening Referral: n/a  Additional Screening:  Hepatitis C Screening: does not qualify; Completed 07/16/21  Vision Screening: Recommended annual ophthalmology exams for early detection of glaucoma and other disorders of  the eye.  Dental Screening: Recommended annual dental exams for proper oral hygiene   Community Resource Referral / Chronic Care Management: CRR required this visit?  No   CCM required this visit?  No     Plan:     I have personally reviewed and noted the following in the patient's chart:   Medical and social history Use of alcohol, tobacco or illicit drugs  Current medications and supplements including opioid prescriptions. Patient is not currently taking opioid prescriptions. Functional ability and status Nutritional status Physical activity Advanced directives List of other physicians Hospitalizations, surgeries, and ER visits in previous 12 months Vitals Screenings to include cognitive, depression, and falls Referrals and appointments  In addition, I have reviewed and discussed with patient certain preventive protocols, quality metrics, and best practice recommendations. A written personalized care plan for preventive services as well as general preventive health recommendations were provided to patient.     Vina Ned, CMA   01/01/2024  After Visit Summary: (Mail) Due to this being a telephonic visit, the after visit summary with patients personalized plan was offered to patient via mail   Nurse Notes:  6 CIT Score - 2 Declined flu vaccine Wants covid and shingles vaccines Gave list of optometrists in the area.

## 2024-01-25 NOTE — Patient Instructions (Signed)
Managing Anxiety, Adult  After being diagnosed with anxiety, you may be relieved to know why you have felt or behaved a certain way. You may also feel overwhelmed about the treatment ahead and what it will mean for your life. With care and support, you can manage your anxiety.  How to manage lifestyle changes  Understanding the difference between stress and anxiety  Although stress can play a role in anxiety, it is not the same as anxiety. Stress is your body's reaction to life changes and events, both good and bad. Stress is often caused by something external, such as a deadline, test, or competition. It normally goes away after the event has ended and will last just a few hours. But, stress can be ongoing and can lead to more than just stress.  Anxiety is caused by something internal, such as imagining a terrible outcome or worrying that something will go wrong that will greatly upset you. Anxiety often does not go away even after the event is over, and it can become a long-term (chronic) worry.  Lowering stress and anxiety    Talk with your health care provider or a counselor to learn more about lowering anxiety and stress. They may suggest tension-reduction techniques, such as:  Music. Spend time creating or listening to music that you enjoy and that inspires you.  Mindfulness-based meditation. Practice being aware of your normal breaths while not trying to control your breathing. It can be done while sitting or walking.  Centering prayer. Focus on a word, phrase, or sacred image that means something to you and brings you peace.  Deep breathing. Expand your stomach and inhale slowly through your nose. Hold your breath for 3-5 seconds. Then breathe out slowly, letting your stomach muscles relax.  Self-talk. Learn to notice and spot thought patterns that lead to anxiety reactions. Change those patterns to thoughts that feel peaceful.  Muscle relaxation. Take time to tense muscles and then relax them.  Choose a  tension-reduction technique that fits your lifestyle and personality. These techniques take time and practice. Set aside 5-15 minutes a day to do them. Specialized therapists can offer counseling and training in these techniques. The training to help with anxiety may be covered by some insurance plans.  Other things you can do to manage stress and anxiety include:  Keeping a stress diary. This can help you learn what triggers your reaction and then learn ways to manage your response.  Thinking about how you react to certain situations. You may not be able to control everything, but you can control your response.  Making time for activities that help you relax and not feeling guilty about spending your time in this way.  Doing visual imagery. This involves imagining or creating mental pictures to help you relax.  Practicing yoga. Through yoga poses, you can lower tension and relax.     Medicines  Medicines for anxiety include:  Antidepressant medicines. These are usually prescribed for long-term daily control.  Anti-anxiety medicines. These may be added in severe cases, especially when panic attacks occur.  When used together, medicines, psychotherapy, and tension-reduction techniques may be the most effective treatment.  Relationships  Relationships can play a big part in helping you recover. Spend more time connecting with trusted friends and family members. Think about going to couples counseling if you have a partner, taking family education classes, or going to family therapy. Therapy can help you and others better understand your anxiety.  How to recognize changes in  your anxiety  Everyone responds differently to treatment for anxiety. Recovery from anxiety happens when symptoms lessen and stop interfering with your daily life at home or work. This may mean that you will start to:  Have better concentration and focus. Worry will interfere less in your daily thinking.  Sleep better.  Be less irritable.  Have  more energy.  Have improved memory.  Try to recognize when your condition is getting worse. Contact your provider if your symptoms interfere with home or work and you feel like your condition is not improving.  Follow these instructions at home:  Activity  Exercise. Adults should:  Exercise for at least 150 minutes each week. The exercise should increase your heart rate and make you sweat (moderate-intensity exercise).  Do strengthening exercises at least twice a week.  Get the right amount and quality of sleep. Most adults need 7-9 hours of sleep each night.  Lifestyle    Eat a healthy diet that includes plenty of vegetables, fruits, whole grains, low-fat dairy products, and lean protein.  Do not eat a lot of foods that are high in fats, added sugars, or salt (sodium).  Make choices that simplify your life.  Do not use any products that contain nicotine or tobacco. These products include cigarettes, chewing tobacco, and vaping devices, such as e-cigarettes. If you need help quitting, ask your provider.  Avoid caffeine, alcohol, and certain over-the-counter cold medicines. These may make you feel worse. Ask your pharmacist which medicines to avoid.  General instructions  Take over-the-counter and prescription medicines only as told by your provider.  Keep all follow-up visits. This is to make sure you are managing your anxiety well or if you need more support.  Where to find support  You can get help and support from:  Self-help groups.  Online and [employer].  A trusted spiritual leader.  Couples counseling.  Family education classes.  Family therapy.  Where to find more information  You may find that joining a support group helps you deal with your anxiety. The following sources can help you find counselors or support groups near you:  Mental Health America: mentalhealthamerica.net  Anxiety and Depression Association of [place] (ADAA): adaa.org  [employer] on Mental Illness (NAMI):  nami.org  Contact a health care provider if:  You have a hard time staying focused or finishing tasks.  You spend many hours a day feeling worried about everyday life.  You are very tired because you cannot stop worrying.  You start to have headaches or often feel tense.  You have chronic nausea or diarrhea.  Get help right away if:  Your heart feels like it is racing.  You have shortness of breath.  You have thoughts of hurting yourself or others.  Get help right away if you feel like you may hurt yourself or others, or have thoughts about taking your own life. Go to your nearest emergency room or:  Call 911.  Call the National Suicide Prevention Lifeline at 316-825-8339 or 988. This is open 24 hours a day.  Text the Crisis Text Line at (407)774-9675.  This information is not intended to replace advice given to you by your health care provider. Make sure you discuss any questions you have with your health care provider.  Document Revised: 09/24/2022 Document Reviewed: 04/08/2021  Elsevier Patient Education  2024 [employer].

## 2024-01-30 ENCOUNTER — Encounter: Payer: Self-pay | Admitting: Nurse Practitioner

## 2024-01-30 ENCOUNTER — Ambulatory Visit (INDEPENDENT_AMBULATORY_CARE_PROVIDER_SITE_OTHER): Payer: 59 | Admitting: Nurse Practitioner

## 2024-01-30 VITALS — BP 126/73 | HR 88 | Temp 98.6°F | Ht 63.0 in | Wt 186.2 lb

## 2024-01-30 DIAGNOSIS — E6609 Other obesity due to excess calories: Secondary | ICD-10-CM

## 2024-01-30 DIAGNOSIS — R030 Elevated blood-pressure reading, without diagnosis of hypertension: Secondary | ICD-10-CM

## 2024-01-30 DIAGNOSIS — F4310 Post-traumatic stress disorder, unspecified: Secondary | ICD-10-CM | POA: Diagnosis not present

## 2024-01-30 DIAGNOSIS — F203 Undifferentiated schizophrenia: Secondary | ICD-10-CM

## 2024-01-30 DIAGNOSIS — R7301 Impaired fasting glucose: Secondary | ICD-10-CM | POA: Diagnosis not present

## 2024-01-30 DIAGNOSIS — E782 Mixed hyperlipidemia: Secondary | ICD-10-CM

## 2024-01-30 DIAGNOSIS — E66811 Obesity, class 1: Secondary | ICD-10-CM

## 2024-01-30 DIAGNOSIS — Z6831 Body mass index (BMI) 31.0-31.9, adult: Secondary | ICD-10-CM

## 2024-01-30 LAB — BAYER DCA HB A1C WAIVED: HB A1C (BAYER DCA - WAIVED): 6 % — ABNORMAL HIGH (ref 4.8–5.6)

## 2024-01-30 NOTE — Assessment & Plan Note (Signed)
At goal in office today.  Stopped Losartan in past due to dizziness.  Recommend continue diet focus, with DASH diet.  Check BP at home 3 days a week and report if consistent above 130/80.  If need to start medication consider Lisinopril 2.5 MG or 5 MG, low dose to start.  Check CMP.  Urine ALB 80 July 2024.

## 2024-01-30 NOTE — Assessment & Plan Note (Signed)
A1c 6.1% recent visit.  Continue diet and exercise focus with modest weight loss.  Recheck next visit.

## 2024-01-30 NOTE — Assessment & Plan Note (Signed)
Chronic, ongoing. Continue collaboration with psychiatry and current medication regimen as prescribed by them.  She denies SI/HI.  Recent notes reviewed.  Will obtain any labs they will need next visit.

## 2024-01-30 NOTE — Assessment & Plan Note (Signed)
BMI 32.98.  Recommended eating smaller high protein, low fat meals more frequently and exercising 30 mins a day 5 times a week with a goal of 10-15lb weight loss in the next 3 months. Patient voiced their understanding and motivation to adhere to these recommendations.

## 2024-01-30 NOTE — Assessment & Plan Note (Signed)
Chronic, ongoing.  She denies SI/HI.  Continue collaboration with psychiatry and current medication regimen.   

## 2024-01-30 NOTE — Progress Notes (Signed)
BP 126/73 (BP Location: Right Arm, Patient Position: Sitting, Cuff Size: Normal)   Pulse 88   Temp 98.6 F (37 C) (Oral)   Ht 5\' 3"  (1.6 m)   Wt 186 lb 3.2 oz (84.5 kg)   LMP 03/28/2016   SpO2 98%   BMI 32.98 kg/m    Subjective:    Patient ID: Megan [name], female    DOB: 1961-12-08, 63 y.o.   MRN: [id number]  HPI: VIANNY SCHRAEDER is a 63 y.o. female  Chief Complaint  Patient presents with   Depression   Hypertension   Prediabetes   Impaired Fasting Glucose HbA1C:  Lab Results  Component Value Date   HGBA1C 6.0 (H) 01/30/2024  Duration of elevated blood sugar: chronic Polydipsia: no Polyuria: for a little bit Weight change: no Visual disturbance: no Glucose Monitoring: no    Accucheck frequency: Not Checking    Fasting glucose:     Post prandial:  Diabetic Education: Not Completed Family history of diabetes: yes   HYPERTENSION Continues on Rosuvastatin daily. Hypertension status: controlled  Satisfied with current treatment? yes Duration of hypertension: years BP monitoring frequency:  not checking BP range:  BP medication side effects:  no Medication compliance: no current medication regimen Aspirin: no Recurrent headaches: no Visual changes: no Palpitations: no Dyspnea: no Chest pain: no Lower extremity edema: no Dizzy/lightheaded: no  The 10-year ASCVD risk score (Arnett DK, et al., 2019) is: 17.5%   Values used to calculate the score:     Age: 27 years     Sex: Female     Is Non-Hispanic African American: Yes     Diabetic: Yes     Tobacco smoker: No     Systolic Blood Pressure: 126 mmHg     Is BP treated: No     HDL Cholesterol: 55 mg/dL     Total Cholesterol: 243 mg/dL  SCHIZOPHRENIA & PTSD Follows with Dr. Vanetta Shawl, with last visit 11/30/24.  Taking Trazodone 200 MG nightly, Effexor 150 MG daily, Invega 1.5 MG QHS.   Mood status: stable Satisfied with current treatment?: yes Symptom severity: mild  Duration of current treatment :  years Side effects: no Medication compliance: good compliance Psychotherapy/counseling:  in the past Depressed mood: no Anxious mood: no Anhedonia: no Significant weight loss or gain: no Insomnia: no -- uses Trazodone Fatigue: no Feelings of worthlessness or guilt: no Impaired concentration/indecisiveness: no Suicidal ideations: no Hopelessness: no Crying spells: no    01/30/2024    3:31 PM 01/01/2024   10:09 AM 07/30/2023    3:28 PM 05/05/2023    4:37 PM 01/20/2023    9:05 AM  Depression screen PHQ 2/9  Decreased Interest 0 0 0 0 0  Down, Depressed, Hopeless 0 1 0 0 0  PHQ - 2 Score 0 1 0 0 0  Altered sleeping 0  0  0  Tired, decreased energy 0  0  0  Change in appetite 0  0  0  Feeling bad or failure about yourself  0  0  0  Trouble concentrating 0  0  0  Moving slowly or fidgety/restless 0  0  0  Suicidal thoughts 0  0  0  PHQ-9 Score 0  0  0  Difficult doing work/chores   Not difficult at all  Not difficult at all       01/30/2024    3:32 PM 07/30/2023    3:28 PM 01/20/2023    9:05 AM 12/11/2022  3:03 PM  GAD 7 : Generalized Anxiety Score  Nervous, Anxious, on Edge 0 0 0 0  Control/stop worrying 0 0 0 0  Worry too much - different things 0 0 0 0  Trouble relaxing 0 0 0 0  Restless 0 0 0 0  Easily annoyed or irritable 0 0 0 0  Afraid - awful might happen 0 0 0 0  Total GAD 7 Score 0 0 0 0  Anxiety Difficulty  Not difficult at all Not difficult at all Not difficult at all      Relevant past medical, surgical, family and social history reviewed and updated as indicated. Interim medical history since our last visit reviewed. Allergies and medications reviewed and updated.  Review of Systems  Constitutional:  Negative for activity change, appetite change, diaphoresis, fatigue and fever.  Respiratory:  Negative for cough, chest tightness and shortness of breath.   Cardiovascular:  Negative for chest pain, palpitations and leg swelling.  Gastrointestinal: Negative.    Neurological: Negative.   Psychiatric/Behavioral: Negative.      Per HPI unless specifically indicated above     Objective:    BP 126/73 (BP Location: Right Arm, Patient Position: Sitting, Cuff Size: Normal)   Pulse 88   Temp 98.6 F (37 C) (Oral)   Ht 5\' 3"  (1.6 m)   Wt 186 lb 3.2 oz (84.5 kg)   LMP 03/28/2016   SpO2 98%   BMI 32.98 kg/m   Wt Readings from Last 3 Encounters:  01/30/24 186 lb 3.2 oz (84.5 kg)  01/01/24 180 lb (81.6 kg)  12/01/23 181 lb 12.8 oz (82.5 kg)    Physical Exam Vitals and nursing note reviewed.  Constitutional:      General: She is awake. She is not in acute distress.    Appearance: She is well-developed and well-groomed. She is obese. She is not ill-appearing or toxic-appearing.  HENT:     Head: Normocephalic.     Right Ear: Hearing normal.     Left Ear: Hearing normal.     Nose: Nose normal.     Mouth/Throat:     Mouth: Mucous membranes are moist.  Eyes:     General: Lids are normal.        Right eye: No discharge.        Left eye: No discharge.     Conjunctiva/sclera: Conjunctivae normal.     Pupils: Pupils are equal, round, and reactive to light.  Neck:     Thyroid: No thyromegaly.     Vascular: No carotid bruit or JVD.  Cardiovascular:     Rate and Rhythm: Normal rate and regular rhythm.     Heart sounds: Normal heart sounds. No murmur heard.    No gallop.  Pulmonary:     Effort: Pulmonary effort is normal.     Breath sounds: Normal breath sounds.  Abdominal:     General: Bowel sounds are normal.     Palpations: Abdomen is soft.  Musculoskeletal:     Cervical back: Normal range of motion and neck supple.     Right lower leg: No edema.     Left lower leg: No edema.  Lymphadenopathy:     Cervical: No cervical adenopathy.  Skin:    General: Skin is warm and dry.  Neurological:     Mental Status: She is alert and oriented to person, place, and time.  Psychiatric:        Attention and Perception: Attention normal.  Mood and Affect: Mood normal.        Behavior: Behavior normal. Behavior is cooperative.        Thought Content: Thought content normal.        Judgment: Judgment normal.     Results for orders placed or performed in visit on 01/30/24  Bayer DCA Hb A1c Waived   Collection Time: 01/30/24  3:51 PM  Result Value Ref Range   HB A1C (BAYER DCA - WAIVED) 6.0 (H) 4.8 - 5.6 %      Assessment & Plan:   Problem List Items Addressed This Visit       Endocrine   IFG (impaired fasting glucose)   A1c 6.1% recent visit.  Continue diet and exercise focus with modest weight loss.  Recheck next visit.      Relevant Orders   Bayer DCA Hb A1c Waived (Completed)     Other   Elevated blood pressure reading   At goal in office today.  Stopped Losartan in past due to dizziness.  Recommend continue diet focus, with DASH diet.  Check BP at home 3 days a week and report if consistent above 130/80.  If need to start medication consider Lisinopril 2.5 MG or 5 MG, low dose to start.  Check CMP.  Urine ALB 80 July 2024.       Relevant Orders   Comprehensive metabolic panel   Hyperlipidemia   Chronic, ongoing. Tolerating Rosuvastatin, continue this and adjust as needed.  Labs next visit.      Relevant Orders   Comprehensive metabolic panel   Lipid Panel w/o Chol/HDL Ratio   Obesity   BMI 40.98.  Recommended eating smaller high protein, low fat meals more frequently and exercising 30 mins a day 5 times a week with a goal of 10-15lb weight loss in the next 3 months. Patient voiced their understanding and motivation to adhere to these recommendations.       PTSD (post-traumatic stress disorder)   Chronic, ongoing.  She denies SI/HI.  Continue collaboration with psychiatry and current medication regimen.        Schizophrenia, undifferentiated (HCC) - Primary   Chronic, ongoing. Continue collaboration with psychiatry and current medication regimen as prescribed by them.  She denies SI/HI.  Recent notes  reviewed.  Will obtain any labs they will need next visit.      Relevant Orders   Comprehensive metabolic panel     Follow up plan: Return in about 6 months (around 07/29/2024) for Annual Physical - after 07/29/24.

## 2024-01-30 NOTE — Assessment & Plan Note (Signed)
Chronic, ongoing. Tolerating Rosuvastatin, continue this and adjust as needed.  Labs next visit.

## 2024-01-31 LAB — COMPREHENSIVE METABOLIC PANEL
ALT: 24 [IU]/L (ref 0–32)
AST: 15 [IU]/L (ref 0–40)
Albumin: 3.9 g/dL (ref 3.9–4.9)
Alkaline Phosphatase: 52 [IU]/L (ref 44–121)
BUN/Creatinine Ratio: 14 (ref 12–28)
BUN: 10 mg/dL (ref 8–27)
Bilirubin Total: <0.2 mg/dL (ref 0.0–1.2)
CO2: 24 mmol/L (ref 20–29)
Calcium: 8.8 mg/dL (ref 8.7–10.3)
Chloride: 106 mmol/L (ref 96–106)
Creatinine, Ser: 0.72 mg/dL (ref 0.57–1.00)
Globulin, Total: 2.1 g/dL (ref 1.5–4.5)
Glucose: 109 mg/dL — ABNORMAL HIGH (ref 70–99)
Potassium: 4.3 mmol/L (ref 3.5–5.2)
Sodium: 141 mmol/L (ref 134–144)
Total Protein: 6 g/dL (ref 6.0–8.5)
eGFR: 94 mL/min/{1.73_m2} (ref >59)

## 2024-01-31 LAB — LIPID PANEL W/O CHOL/HDL RATIO
Cholesterol, Total: 235 mg/dL — ABNORMAL HIGH (ref 100–199)
HDL: 55 mg/dL (ref >39)
LDL Chol Calc (NIH): 153 mg/dL — ABNORMAL HIGH (ref 0–99)
Triglycerides: 150 mg/dL — ABNORMAL HIGH (ref 0–149)
VLDL Cholesterol Cal: 27 mg/dL (ref 5–40)

## 2024-02-01 ENCOUNTER — Other Ambulatory Visit: Payer: Self-pay | Admitting: Nurse Practitioner

## 2024-02-01 MED ORDER — ROSUVASTATIN CALCIUM 20 MG PO TABS: 20.0000 mg | ORAL_TABLET | Freq: Every day | ORAL | 3 refills | Status: DC

## 2024-02-01 NOTE — Progress Notes (Signed)
Good morning crew, please let Megan [name] know her labs have returned and are overall stable with exception that lipid panel continues to show cholesterol level above goal.  I want her to stop the 10 MG Rosuvastatin she is taking and start 20 MG dosing.  This may help to lower levels and prevent stroke.  Any questions? Keep being stellar!!  Thank you for allowing me to participate in your care.  I appreciate you. Kindest regards, Aadhira Heffernan

## 2024-02-04 ENCOUNTER — Other Ambulatory Visit: Payer: Self-pay | Admitting: Nurse Practitioner

## 2024-02-04 NOTE — Telephone Encounter (Signed)
 Medication Refill -  Most Recent Primary Care Visit:  Provider: CANNADY, JOLENE T  Department: [id number]  Visit Type: OFFICE VISIT  Date: 01/30/2024  Medication: meloxicam  (MOBIC ) 7.5 MG tablet [[id number]]   Has the patient contacted their pharmacy? Yes  (Agent: If yes, when and what did the pharmacy advise?) Contact Office   Is this the correct pharmacy for this prescription? Yes  This is the patient's preferred pharmacy:  [id number] - [place], [place] - [address] [place] [address] Phone: [phone] Fax: [phone]   Has the prescription been filled recently? Yes  Is the patient out of the medication? Yes  Has the patient been seen for an appointment in the last year OR does the patient have an upcoming appointment? Yes  Can we respond through MyChart? No  Agent: Please be advised that Rx refills may take up to 3 business days. We ask that you follow-up with your pharmacy.

## 2024-02-06 MED ORDER — MELOXICAM 7.5 MG PO TABS: 7.5000 mg | ORAL_TABLET | Freq: Every day | ORAL | 1 refills | Status: DC

## 2024-02-06 NOTE — Telephone Encounter (Signed)
 Requested medication (s) are due for refill today: Yes  Requested medication (s) are on the active medication list: Yes  Last refill:  08/27/22  Future visit scheduled: Yes  Notes to clinic:  Manual review.    Requested Prescriptions  Pending Prescriptions Disp Refills   meloxicam  (MOBIC ) 7.5 MG tablet 90 tablet 1    Sig: Take 1 tablet (7.5 mg total) by mouth daily.     Analgesics:  COX2 Inhibitors Failed - 02/06/2024  7:45 AM      Failed - Manual Review: Labs are only required if the patient has taken medication for more than 8 weeks.      Passed - HGB in normal range and within 360 days    Hemoglobin  Date Value Ref Range Status  07/30/2023 13.9 11.1 - 15.9 g/dL Final         Passed - Cr in normal range and within 360 days    Creatinine, Ser  Date Value Ref Range Status  01/30/2024 0.72 0.57 - 1.00 mg/dL Final         Passed - HCT in normal range and within 360 days    Hematocrit  Date Value Ref Range Status  07/30/2023 42.0 34.0 - 46.6 % Final         Passed - AST in normal range and within 360 days    AST  Date Value Ref Range Status  01/30/2024 15 0 - 40 IU/L Final         Passed - ALT in normal range and within 360 days    ALT  Date Value Ref Range Status  01/30/2024 24 0 - 32 IU/L Final         Passed - eGFR is 30 or above and within 360 days    GFR calc Af Amer  Date Value Ref Range Status  06/19/2020 91 >59 mL/min/1.73 Final    Comment:    **[facility] currently reports eGFR in compliance with the current**   recommendations of the [employer]. [facility] will   update reporting as new guidelines are published from the NKF-ASN   Task force.    GFR calc non Af Amer  Date Value Ref Range Status  06/19/2020 79 >59 mL/min/1.73 Final   eGFR  Date Value Ref Range Status  01/30/2024 94 >59 mL/min/1.73 Final         Passed - Patient is not pregnant      Passed - Valid encounter within last 12 months    Recent Outpatient Visits            1 week ago Schizophrenia, undifferentiated (HCC)   [place] [facility] [place], [place] T, NP   6 months ago Schizophrenia, undifferentiated (HCC)   [place] [facility] [place], Jolene T, NP   1 year ago Schizophrenia, undifferentiated (HCC)   [place] [facility] [place], Jolene T, NP   1 year ago Schizophrenia, undifferentiated (HCC)   [facility] [place], Jolene T, NP   1 year ago Schizophrenia, undifferentiated (HCC)   [place] [facility] [place], Melanie DASEN, NP       Future Appointments             In 5 months Cannady, Jolene T, NP [facility]

## 2024-03-18 ENCOUNTER — Other Ambulatory Visit: Payer: Self-pay | Admitting: Psychiatry
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
## 2024-03-29 ENCOUNTER — Ambulatory Visit (INDEPENDENT_AMBULATORY_CARE_PROVIDER_SITE_OTHER): Payer: Self-pay | Admitting: Psychiatry

## 2024-03-29 ENCOUNTER — Encounter: Payer: Self-pay | Admitting: Psychiatry

## 2024-03-29 VITALS — BP 112/66 | HR 92 | Temp 97.8°F | Ht 63.0 in | Wt 186.2 lb

## 2024-03-29 DIAGNOSIS — G4700 Insomnia, unspecified: Secondary | ICD-10-CM | POA: Diagnosis not present

## 2024-03-29 DIAGNOSIS — F209 Schizophrenia, unspecified: Secondary | ICD-10-CM | POA: Diagnosis not present

## 2024-03-29 DIAGNOSIS — F4310 Post-traumatic stress disorder, unspecified: Secondary | ICD-10-CM

## 2024-03-29 DIAGNOSIS — R632 Polyphagia: Secondary | ICD-10-CM

## 2024-03-29 DIAGNOSIS — F3342 Major depressive disorder, recurrent, in full remission: Secondary | ICD-10-CM | POA: Diagnosis not present

## 2024-03-29 MED ORDER — TRAZODONE HCL 100 MG PO TABS: 200.0000 mg | ORAL_TABLET | Freq: Every evening | ORAL | 3 refills | Status: DC | PRN

## 2024-03-29 MED ORDER — PALIPERIDONE ER 3 MG PO TB24: 3.0000 mg | ORAL_TABLET | Freq: Every day | ORAL | 2 refills | Status: DC

## 2024-03-29 MED ORDER — TOPIRAMATE 25 MG PO CPSP: 25.0000 mg | ORAL_CAPSULE | Freq: Every day | ORAL | 2 refills | Status: DC

## 2024-03-29 NOTE — Patient Instructions (Signed)
 Decrease Invega 3 mg at night Continue venlafaxine 150 mg daily  Start topiramate 25 mg a night  Continue trazodone 200 mg at night as needed for insomnia  Next appointment: 6/2 at 1 pm.

## 2024-04-22 ENCOUNTER — Other Ambulatory Visit: Payer: Self-pay | Admitting: Nurse Practitioner

## 2024-04-22 NOTE — Telephone Encounter (Signed)
 Copied from CRM [id number]. Topic: Clinical - Medication Refill >> Apr 22, 2024  1:59 PM Lizabeth Riggs wrote: Most Recent Primary Care Visit:  Provider: CANNADY, JOLENE T  Department: [id number]  Visit Type: OFFICE VISIT  Date: 01/30/2024  Medication: Vitamin D , Ergocalciferol , (DRISDOL ) 1.25 MG (50000 UNIT) CAPS capsule - 90 day supply  Has the patient contacted their pharmacy? Yes (Agent: If no, request that the patient contact the pharmacy for the refill. If patient does not wish to contact the pharmacy document the reason why and proceed with request.) (Agent: If yes, when and what did the pharmacy advise?) Pharmacy needs order to refill  Is this the correct pharmacy for this prescription? Yes If no, delete pharmacy and type the correct one.  This is the patient's preferred pharmacy:  [id number] - [place], [place] - [address] [place] [address] Phone: [phone] Fax: [phone]   Has the prescription been filled recently? No  Is the patient out of the medication? No  Has the patient been seen for an appointment in the last year OR does the patient have an upcoming appointment? Yes  Can we respond through MyChart? No  Agent: Please be advised that Rx refills may take up to 3 business days. We ask that you follow-up with your pharmacy.

## 2024-04-23 NOTE — Telephone Encounter (Signed)
 Requested medications are due for refill today.  no  Requested medications are on the active medications list.  yes  Last refill. 07/30/2023 #12 4 rf  Future visit scheduled.   yes  Notes to clinic.  Refill not delegated at this dosage.    Requested Prescriptions  Pending Prescriptions Disp Refills   Vitamin D , Ergocalciferol , (DRISDOL ) 1.25 MG (50000 UNIT) CAPS capsule 12 capsule 4    Sig: Take 1 capsule (50,000 Units total) by mouth every 7 (seven) days.     Endocrinology:  Vitamins - Vitamin D  Supplementation 2 Failed - 04/23/2024 11:22 AM      Failed - Manual Review: Route requests for 50,000 IU strength to the provider      Failed - Vitamin D  in normal range and within 360 days    Vit D, 25-Hydroxy  Date Value Ref Range Status  07/30/2023 20.7 (L) 30.0 - 100.0 ng/mL Final    Comment:    Vitamin D  deficiency has been defined by the Institute of Medicine and an Endocrine Society practice guideline as a level of serum 25-OH vitamin D  less than 20 ng/mL (1,2). The Endocrine Society went on to further define vitamin D  insufficiency as a level between 21 and 29 ng/mL (2). 1. IOM (Institute of Medicine). 2010. Dietary reference    intakes for calcium  and D. [place]: The    [employer]. 2. Holick MF, Binkley [place], Bischoff-Ferrari HA, et al.    Evaluation, treatment, and prevention of vitamin D     deficiency: an Endocrine Society clinical practice    guideline. JCEM. 2011 Jul; 96(7):1911-30.          Failed - Valid encounter within last 12 months    Recent Outpatient Visits   None     Future Appointments             In 3 months Cannady, Lavelle Posey, NP [facility]            Passed - Ca in normal range and within 360 days    Calcium   Date Value Ref Range Status  01/30/2024 8.8 8.7 - 10.3 mg/dL Final

## 2024-05-15 NOTE — Patient Instructions (Signed)
 Muscle and Joint Pain: What It Means Muscle and joint pain is aches and pains in your bones, joints, muscles, and tissues that surround them. This pain can happen in any part of the body. It can last for a short time or a long time. To find the cause of the pain, you may have: A physical exam. Lab tests. Imaging tests. Follow these instructions at home: Medicines Take your medicines only as told. Treatment may include medicines for pain and inflammation. You might need to take medicines by mouth or put them on your skin. Managing pain, stiffness, and swelling     Use ice or an ice pack as told. Place a towel between your skin and the ice. Leave the ice on for 20 minutes, 2-3 times a day. Use heat as told. Use the heat source that your health care provider recommends, such as a moist heat pack or a heating pad. Do this as often as told. Place a towel between your skin and the heat source. Leave the heat on for 20-30 minutes. If your skin turns red, take off the ice or heat right away to prevent burns. The risk of burns is higher if you can't feel pain, heat, or cold. Activity When your pain is very bad, bed rest may be helpful. Lie or sit in any position that's comfortable. Get up to take short walks at least every 2 hours during the day. This helps you breathe better and keeps your blood flowing. Ask for help if you feel weak or unsteady. You may continue doing your usual activities unless the activities cause more pain. When the pain gets better, slowly increase the time and intensity of your activities or exercise. Lifestyle Learn to manage anxiety and stress. Stress increases muscle tension and can make pain worse. Ask your provider if you should try: Meditation or yoga. Therapy. Acupuncture or massage. General instructions Your provider may tell you to see a physical therapist to help you do exercises to improve movement and strength in the affected area. Exercise as  told. Contact a health care provider if: Your pain gets worse. Medicines do not help your pain. You can't use the part of your body that hurts, such as your arm, leg, or neck. You have trouble sleeping. You have trouble doing your usual activities. Get help right away if: You have a new injury and your pain is worse or different. You feel numb or you have tingling in the painful area. This information is not intended to replace advice given to you by your health care provider. Make sure you discuss any questions you have with your health care provider. Document Revised: 05/16/2023 Document Reviewed: 04/30/2023 Elsevier Patient Education  2024 [employer].

## 2024-05-18 ENCOUNTER — Ambulatory Visit (INDEPENDENT_AMBULATORY_CARE_PROVIDER_SITE_OTHER): Admitting: Nurse Practitioner

## 2024-05-18 ENCOUNTER — Encounter: Payer: Self-pay | Admitting: Nurse Practitioner

## 2024-05-18 VITALS — BP 118/68 | HR 92 | Temp 98.5°F | Ht 63.0 in | Wt 188.2 lb

## 2024-05-18 DIAGNOSIS — F4310 Post-traumatic stress disorder, unspecified: Secondary | ICD-10-CM | POA: Diagnosis not present

## 2024-05-18 DIAGNOSIS — F203 Undifferentiated schizophrenia: Secondary | ICD-10-CM | POA: Diagnosis not present

## 2024-05-18 NOTE — Progress Notes (Signed)
 BP 118/68   Pulse 92   Temp 98.5 F (36.9 C) (Oral)   Ht 5\' 3"  (1.6 m)   Wt 188 lb 3.2 oz (85.4 kg)   LMP 03/28/2016   SpO2 97%   BMI 33.34 kg/m    Subjective:    Patient ID: Megan [name], female    DOB: July 09, 1961, 63 y.o.   MRN: [id number]  HPI: Megan [name] is a 63 y.o. female  Chief Complaint  Patient presents with   Medication Problem    Patient states she was having issues with taking the topiramate . States it was giving her stomach pains, dizziness, diarrhea, and vomiting. Patient states she stopped the medication due to these symptoms.    Anxiety    Patient states she has been having an increase in her anxiety, wants to discuss being referred to another facility    SCHIZOPHRENIA & PTSD Presents today to discuss side effects with her Topamax , was started on this by psychiatry on 03/29/24 which she reports was for weight loss. She stopped Topamax .  She also reports worsening anxiety and is requesting referral to alternate psychiatry facility due to concerns with current one -- reports she has been dealing with this for over a year and holding it in, but now it is stressing her as she does not connect with current provider. Currently is taking Trazodone , Effexor , and Invega . Is feeling more anxious since took Topamax , but this is improving. Duration:exacerbated Anxious mood: yes  Excessive worrying: yes Irritability: yes  Sweating: no Nausea: no Palpitations:no Hyperventilation: no Panic attacks: no Agoraphobia: no  Obscessions/compulsions: no Depressed mood: yes    06-12-24    2:49 PM 01/30/2024    3:31 PM 01/01/2024   10:09 AM 07/30/2023    3:28 PM 05/05/2023    4:37 PM  Depression screen PHQ 2/9  Decreased Interest 0 0 0 0   Down, Depressed, Hopeless 3 0 1 0   PHQ - 2 Score 3 0 1 0   Altered sleeping 3 0  0   Tired, decreased energy 3 0  0   Change in appetite 0 0  0   Feeling bad or failure about yourself  0 0  0   Trouble concentrating 3 0  0    Moving slowly or fidgety/restless 0 0  0   Suicidal thoughts 0 0  0   PHQ-9 Score 12 0  0   Difficult doing work/chores Somewhat difficult   Not difficult at all      Information is confidential and restricted. Go to Review Flowsheets to unlock data.  Anhedonia: no Weight changes: no Insomnia: reports she will tonight, now that knows she can see a new psychiatrist Hypersomnia: no Fatigue/loss of energy: no Feelings of worthlessness: no Feelings of guilt: no Impaired concentration/indecisiveness: no Suicidal ideations: no  Crying spells: no Recent Stressors/Life Changes: yes    Relationship problems: no   Family stress: no     Financial stress: no    Job stress: no    Recent death/loss: no    06-12-2024    2:49 PM 01/30/2024    3:32 PM 07/30/2023    3:28 PM 01/20/2023    9:05 AM  GAD 7 : Generalized Anxiety Score  Nervous, Anxious, on Edge 3 0 0 0  Control/stop worrying 3 0 0 0  Worry too much - different things 3 0 0 0  Trouble relaxing 3 0 0 0  Restless 3 0 0 0  Easily  annoyed or irritable 3 0 0 0  Afraid - awful might happen 3 0 0 0  Total GAD 7 Score 21 0 0 0  Anxiety Difficulty Not difficult at all  Not difficult at all Not difficult at all   Relevant past medical, surgical, family and social history reviewed and updated as indicated. Interim medical history since our last visit reviewed. Allergies and medications reviewed and updated.  Review of Systems  Constitutional:  Negative for activity change, appetite change, diaphoresis, fatigue and fever.  Respiratory:  Negative for cough, chest tightness and shortness of breath.   Cardiovascular:  Negative for chest pain, palpitations and leg swelling.  Gastrointestinal: Negative.   Neurological: Negative.   Psychiatric/Behavioral:  Positive for sleep disturbance (improved now). Negative for decreased concentration, self-injury and suicidal ideas. The patient is nervous/anxious.     Per HPI unless specifically  indicated above     Objective:     BP 118/68   Pulse 92   Temp 98.5 F (36.9 C) (Oral)   Ht 5\' 3"  (1.6 m)   Wt 188 lb 3.2 oz (85.4 kg)   LMP 03/28/2016   SpO2 97%   BMI 33.34 kg/m   Wt Readings from Last 3 Encounters:  05/18/24 188 lb 3.2 oz (85.4 kg)  01/30/24 186 lb 3.2 oz (84.5 kg)  01/01/24 180 lb (81.6 kg)    Physical Exam Vitals and nursing note reviewed.  Constitutional:      General: She is awake. She is not in acute distress.    Appearance: She is well-developed and well-groomed. She is obese. She is not ill-appearing or toxic-appearing.  HENT:     Head: Normocephalic.     Right Ear: Hearing normal.     Left Ear: Hearing normal.     Nose: Nose normal.     Mouth/Throat:     Mouth: Mucous membranes are moist.  Eyes:     General: Lids are normal.        Right eye: No discharge.        Left eye: No discharge.     Conjunctiva/sclera: Conjunctivae normal.     Pupils: Pupils are equal, round, and reactive to light.  Neck:     Thyroid : No thyromegaly.     Vascular: No carotid bruit or JVD.  Cardiovascular:     Rate and Rhythm: Normal rate and regular rhythm.     Heart sounds: Normal heart sounds. No murmur heard.    No gallop.  Pulmonary:     Effort: Pulmonary effort is normal.     Breath sounds: Normal breath sounds.  Abdominal:     General: Bowel sounds are normal.     Palpations: Abdomen is soft.  Musculoskeletal:     Cervical back: Normal range of motion and neck supple.     Right lower leg: No edema.     Left lower leg: No edema.  Lymphadenopathy:     Cervical: No cervical adenopathy.  Skin:    General: Skin is warm and dry.  Neurological:     Mental Status: She is alert and oriented to person, place, and time.  Psychiatric:        Attention and Perception: Attention normal.        Mood and Affect: Mood normal.        Behavior: Behavior normal. Behavior is cooperative.        Thought Content: Thought content normal.        Judgment: Judgment  normal.  Results for orders placed or performed in visit on 01/30/24  Bayer DCA Hb A1c Waived   Collection Time: 01/30/24  3:51 PM  Result Value Ref Range   HB A1C (BAYER DCA - WAIVED) 6.0 (H) 4.8 - 5.6 %  Comprehensive metabolic panel   Collection Time: 01/30/24  3:51 PM  Result Value Ref Range   Glucose 109 (H) 70 - 99 mg/dL   BUN 10 8 - 27 mg/dL   Creatinine, Ser 8.46 0.57 - 1.00 mg/dL   eGFR 94 >96 EX/BMW/4.13   BUN/Creatinine Ratio 14 12 - 28   Sodium 141 134 - 144 mmol/L   Potassium 4.3 3.5 - 5.2 mmol/L   Chloride 106 96 - 106 mmol/L   CO2 24 20 - 29 mmol/L   Calcium  8.8 8.7 - 10.3 mg/dL   Total Protein 6.0 6.0 - 8.5 g/dL   Albumin 3.9 3.9 - 4.9 g/dL   Globulin, Total 2.1 1.5 - 4.5 g/dL   Bilirubin Total <2.4 0.0 - 1.2 mg/dL   Alkaline Phosphatase 52 44 - 121 IU/L   AST 15 0 - 40 IU/L   ALT 24 0 - 32 IU/L  Lipid Panel w/o Chol/HDL Ratio   Collection Time: 01/30/24  3:51 PM  Result Value Ref Range   Cholesterol, Total 235 (H) 100 - 199 mg/dL   Triglycerides 401 (H) 0 - 149 mg/dL   HDL 55 >02 mg/dL   VLDL Cholesterol Cal 27 5 - 40 mg/dL   LDL Chol Calc (NIH) 725 (H) 0 - 99 mg/dL      Assessment & Plan:   Problem List Items Addressed This Visit       Other   Schizophrenia, undifferentiated (HCC) - Primary   Chronic, ongoing, reports exacerbated by relationship with current psychiatrist and requesting referral elsewhere.  Continue collaboration with psychiatry and current medication regimen as prescribed by them.  She denies SI/HI.  Recent notes reviewed.  New referral placed per her request.  Will obtain EKG and Prolactin at yearly physical exams for psychiatry due to Invega  use.      Relevant Orders   Ambulatory referral to Psychiatry   PTSD (post-traumatic stress disorder)   Chronic, ongoing.  She denies SI/HI.  Continue collaboration with psychiatry and current medication regimen.  Referral to new psychiatrist placed.      Relevant Orders   Ambulatory  referral to Psychiatry     Follow up plan: Return for as scheduled August 5th.

## 2024-05-18 NOTE — Assessment & Plan Note (Signed)
 Chronic, ongoing.  She denies SI/HI.  Continue collaboration with psychiatry and current medication regimen.  Referral to new psychiatrist placed.

## 2024-05-18 NOTE — Assessment & Plan Note (Signed)
 Chronic, ongoing, reports exacerbated by relationship with current psychiatrist and requesting referral elsewhere.  Continue collaboration with psychiatry and current medication regimen as prescribed by them.  She denies SI/HI.  Recent notes reviewed.  New referral placed per her request.  Will obtain EKG and Prolactin at yearly physical exams for psychiatry due to Invega  use.

## 2024-05-25 ENCOUNTER — Telehealth: Payer: Self-pay

## 2024-05-25 NOTE — Telephone Encounter (Signed)
 Copied from CRM [id number]. Topic: Referral - Status >> May 21, 2024  2:14 PM [place] A wrote: Reason for CRM: Patient called said the referral that was given to her does not accept her Medicaid and she needs a different referral

## 2024-05-25 NOTE — Telephone Encounter (Signed)
 Routing to referral team to check on this for the patient.
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
## 2024-05-31 ENCOUNTER — Ambulatory Visit: Admitting: Psychiatry

## 2024-05-31 ENCOUNTER — Telehealth: Payer: Self-pay | Admitting: Psychiatry

## 2024-05-31 NOTE — Telephone Encounter (Signed)
 Called patient to reschedule her no show for today. She states that her primary doctor sent you an email stating she would not be returning to this office. She is bring referred to another provider that will take her Medicaid. She has enough medication till August. But she will not be returning.

## 2024-06-24 ENCOUNTER — Other Ambulatory Visit: Payer: Self-pay | Admitting: Psychiatry

## 2024-06-24 NOTE — Telephone Encounter (Signed)
 She has transferred her care to another provider. Please advise her to contact her new provider or primary care physician for any medication refills.

## 2024-06-24 NOTE — Telephone Encounter (Signed)
 Patient stated that she has enough medication to last her until September and does not need a refill

## 2024-07-15 ENCOUNTER — Other Ambulatory Visit: Payer: Self-pay | Admitting: Psychiatry

## 2024-07-19 ENCOUNTER — Other Ambulatory Visit: Payer: Self-pay | Admitting: Nurse Practitioner

## 2024-07-21 ENCOUNTER — Other Ambulatory Visit: Payer: Self-pay | Admitting: Nurse Practitioner

## 2024-07-21 ENCOUNTER — Other Ambulatory Visit: Payer: Self-pay | Admitting: Psychiatry

## 2024-07-21 NOTE — Telephone Encounter (Signed)
 Copied from CRM [id number]. Topic: Clinical - Medication Refill >> Jul 21, 2024  4:03 PM Edsel HERO wrote: Medication: paliperidone  (INVEGA ) 3 MG 24 hr tablet *patient states that Jolene Cannady said she would refill medication for patient while she tries to find another provider.   Has the patient contacted their pharmacy? Yes  This is the patient's preferred pharmacy:  [id number] - [place], [place] - [address] [place] [address] Phone: [phone] Fax: [phone]  Is this the correct pharmacy for this prescription? Yes If no, delete pharmacy and type the correct one.   Has the prescription been filled recently? Yes  Is the patient out of the medication? Yes  Has the patient been seen for an appointment in the last year OR does the patient have an upcoming appointment? Yes  Can we respond through MyChart? Yes

## 2024-07-23 NOTE — Telephone Encounter (Signed)
 Requested medications are due for refill today.  yes  Requested medications are on the active medications list.  yes  Last refill. 03/29/2024 #30 2 rf  Future visit scheduled.   yes  Notes to clinic.  Refill not delegated. Rx has expired. Rx signed by another provider.  FYI: *patient states that Jolene Cannady said she would refill medication for patient while she tries to find another provider.    Requested Prescriptions  Pending Prescriptions Disp Refills   paliperidone  (INVEGA ) 3 MG 24 hr tablet 30 tablet 2    Sig: Take 1 tablet (3 mg total) by mouth daily.     Not Delegated - Psychiatry: Antipsychotics - Second Generation (Atypical) - paliperidone  Failed - 07/23/2024 10:46 AM      Failed - This refill cannot be delegated      Failed - Valid encounter within last 6 months    Recent Outpatient Visits           2 months ago Schizophrenia, undifferentiated (HCC)   [facility] [place], Melanie T, NP       Future Appointments             In 1 week Valerio Melanie DASEN, NP  [facility]            Failed - Lipid Panel in normal range within the last 12 months    Cholesterol, Total  Date Value Ref Range Status  01/30/2024 235 (H) 100 - 199 mg/dL Final   LDL Chol Calc (NIH)  Date Value Ref Range Status  01/30/2024 153 (H) 0 - 99 mg/dL Final   HDL  Date Value Ref Range Status  01/30/2024 55 >39 mg/dL Final   Triglycerides  Date Value Ref Range Status  01/30/2024 150 (H) 0 - 149 mg/dL Final         Passed - TSH in normal range and within 360 days    TSH  Date Value Ref Range Status  09/03/2023 0.880 0.450 - 4.500 uIU/mL Final         Passed - Completed PHQ-2 or PHQ-9 in the last 360 days      Passed - Last BP in normal range    BP Readings from Last 1 Encounters:  05/18/24 118/68         Passed - Last Heart Rate in normal range    Pulse Readings from Last 1 Encounters:  05/18/24 92         Passed - CBC  within normal limits and completed in the last 12 months    WBC  Date Value Ref Range Status  07/30/2023 6.1 3.4 - 10.8 x10E3/uL Final  07/31/2019 6.3 4.0 - 10.5 K/uL Final   RBC  Date Value Ref Range Status  07/30/2023 5.06 3.77 - 5.28 x10E6/uL Final  07/31/2019 4.62 3.87 - 5.11 MIL/uL Final   Hemoglobin  Date Value Ref Range Status  07/30/2023 13.9 11.1 - 15.9 g/dL Final   Hematocrit  Date Value Ref Range Status  07/30/2023 42.0 34.0 - 46.6 % Final   MCHC  Date Value Ref Range Status  07/30/2023 33.1 31.5 - 35.7 g/dL Final  91/98/7979 67.6 30.0 - 36.0 g/dL Final   [facility]  Date Value Ref Range Status  07/30/2023 27.5 26.6 - 33.0 pg Final  07/31/2019 27.3 26.0 - 34.0 pg Final   MCV  Date Value Ref Range Status  07/30/2023 83 79 - 97 fL Final   No results found  for: PLTCOUNTKUC, LABPLAT, POCPLA RDW  Date Value Ref Range Status  07/30/2023 14.4 11.7 - 15.4 % Final         Passed - CMP within normal limits and completed in the last 12 months    Albumin  Date Value Ref Range Status  01/30/2024 3.9 3.9 - 4.9 g/dL Final   Alkaline Phosphatase  Date Value Ref Range Status  01/30/2024 52 44 - 121 IU/L Final   ALT  Date Value Ref Range Status  01/30/2024 24 0 - 32 IU/L Final   AST  Date Value Ref Range Status  01/30/2024 15 0 - 40 IU/L Final   BUN  Date Value Ref Range Status  01/30/2024 10 8 - 27 mg/dL Final   Calcium   Date Value Ref Range Status  01/30/2024 8.8 8.7 - 10.3 mg/dL Final   CO2  Date Value Ref Range Status  01/30/2024 24 20 - 29 mmol/L Final   Creatinine, Ser  Date Value Ref Range Status  01/30/2024 0.72 0.57 - 1.00 mg/dL Final   Glucose  Date Value Ref Range Status  01/30/2024 109 (H) 70 - 99 mg/dL Final   Glucose, Bld  Date Value Ref Range Status  07/31/2019 131 (H) 70 - 99 mg/dL Final   Potassium  Date Value Ref Range Status  01/30/2024 4.3 3.5 - 5.2 mmol/L Final   Sodium  Date Value Ref Range Status  01/30/2024 141 134  - 144 mmol/L Final   Bilirubin Total  Date Value Ref Range Status  01/30/2024 <0.2 0.0 - 1.2 mg/dL Final   Protein, ur  Date Value Ref Range Status  07/31/2019 NEGATIVE NEGATIVE mg/dL Final   Protein,UA  Date Value Ref Range Status  06/18/2022 Negative Negative/Trace Final   Total Protein  Date Value Ref Range Status  01/30/2024 6.0 6.0 - 8.5 g/dL Final   GFR calc Af Amer  Date Value Ref Range Status  06/19/2020 91 >59 mL/min/1.73 Final    Comment:    **[facility] currently reports eGFR in compliance with the current**   recommendations of the [employer]. [facility] will   update reporting as new guidelines are published from the NKF-ASN   Task force.    eGFR  Date Value Ref Range Status  01/30/2024 94 >59 mL/min/1.73 Final   GFR calc non Af Amer  Date Value Ref Range Status  06/19/2020 79 >59 mL/min/1.73 Final

## 2024-08-03 ENCOUNTER — Ambulatory Visit: Payer: Disability Insurance | Admitting: Nurse Practitioner

## 2024-08-03 ENCOUNTER — Encounter: Payer: Self-pay | Admitting: Nurse Practitioner

## 2024-08-03 VITALS — BP 125/69 | HR 88 | Temp 98.9°F | Ht 62.6 in | Wt 194.0 lb

## 2024-08-03 DIAGNOSIS — R030 Elevated blood-pressure reading, without diagnosis of hypertension: Secondary | ICD-10-CM | POA: Diagnosis not present

## 2024-08-03 DIAGNOSIS — E782 Mixed hyperlipidemia: Secondary | ICD-10-CM | POA: Diagnosis not present

## 2024-08-03 DIAGNOSIS — E669 Obesity, unspecified: Secondary | ICD-10-CM

## 2024-08-03 DIAGNOSIS — F203 Undifferentiated schizophrenia: Secondary | ICD-10-CM | POA: Diagnosis not present

## 2024-08-03 DIAGNOSIS — E6609 Other obesity due to excess calories: Secondary | ICD-10-CM

## 2024-08-03 DIAGNOSIS — E66811 Obesity, class 1: Secondary | ICD-10-CM

## 2024-08-03 DIAGNOSIS — R7301 Impaired fasting glucose: Secondary | ICD-10-CM | POA: Diagnosis not present

## 2024-08-03 DIAGNOSIS — Z0000 Encounter for general adult medical examination without abnormal findings: Secondary | ICD-10-CM

## 2024-08-03 DIAGNOSIS — F4310 Post-traumatic stress disorder, unspecified: Secondary | ICD-10-CM | POA: Diagnosis not present

## 2024-08-03 DIAGNOSIS — E1169 Type 2 diabetes mellitus with other specified complication: Secondary | ICD-10-CM

## 2024-08-03 DIAGNOSIS — E559 Vitamin D deficiency, unspecified: Secondary | ICD-10-CM

## 2024-08-03 DIAGNOSIS — E785 Hyperlipidemia, unspecified: Secondary | ICD-10-CM

## 2024-08-03 DIAGNOSIS — Z6831 Body mass index (BMI) 31.0-31.9, adult: Secondary | ICD-10-CM

## 2024-08-03 LAB — BAYER DCA HB A1C WAIVED: HB A1C (BAYER DCA - WAIVED): 7.4 % — ABNORMAL HIGH (ref 4.8–5.6)

## 2024-08-03 MED ORDER — TRAZODONE HCL 100 MG PO TABS: 200.0000 mg | ORAL_TABLET | Freq: Every evening | ORAL | 1 refills | Status: DC | PRN

## 2024-08-03 MED ORDER — VENLAFAXINE HCL ER 150 MG PO CP24: 150.0000 mg | ORAL_CAPSULE | Freq: Every day | ORAL | 1 refills | Status: AC

## 2024-08-03 MED ORDER — VITAMIN D (ERGOCALCIFEROL) 1.25 MG (50000 UNIT) PO CAPS: 50000.0000 [IU] | ORAL_CAPSULE | ORAL | 4 refills | Status: AC

## 2024-08-03 MED ORDER — PALIPERIDONE ER 3 MG PO TB24: 3.0000 mg | ORAL_TABLET | Freq: Every day | ORAL | 1 refills | Status: DC

## 2024-08-03 MED ORDER — METFORMIN HCL 500 MG PO TABS: 500.0000 mg | ORAL_TABLET | Freq: Two times a day (BID) | ORAL | 3 refills | Status: AC

## 2024-08-03 NOTE — Assessment & Plan Note (Signed)
 Chronic, ongoing. Tolerating Rosuvastatin , continue this and adjust as needed.  Labs obtained today.

## 2024-08-03 NOTE — Patient Instructions (Signed)
 Diabetes Action Plan A diabetes action plan is a way for you to manage your symptoms of diabetes, also called diabetes mellitus. The plan is color-coded to guide you on what actions to take based on any symptoms you're having. If you have symptoms in the red zone, you need medical care right away. If you have symptoms in the yellow zone, your diabetes isn't under control, and you may need to make some changes. If you have symptoms in the green zone, you're doing well. Understanding diabetes can take time. Follow the treatment plan that you created with your health care provider. Know the target range for your blood sugar, also called glucose. Review your plan each time you visit your provider. The target range for my blood sugar level is __________________________ mg/dL. Red zone Get medical help right away if you have any of the following symptoms: A blood sugar test result that's below 54 mg/dL (3 mmol/L). A blood sugar test result that's at or above 240 mg/dL (16.1 mmol/L) for 2 days in a row along with: Extreme thirst and frequent peeing. Confusion or trouble thinking clearly. Moderate or large ketone levels in your pee (urine). Feeling tired or having no energy. Trouble breathing. Sickness or a fever for 2 or more days that's not getting better. These symptoms may be an emergency. Call 911 right away. Do not wait to see if the symptoms will go away. Do not drive yourself to the hospital. If you have very low blood sugar, also called severe hypoglycemia, and you can't eat or drink, you may need glucagon. Make sure a family member or close friend knows how to check your blood sugar and how to give you glucagon. You may need to be treated in a hospital for this condition. Yellow zone If you have any of the following symptoms, your diabetes isn't under control, and you may need to make some changes: A blood sugar test result that's at or above 240 mg/dL (09.6 mmol/L) for 2 days in a  row. Blood sugar test results that are below 70 mg/dL (3.9 mmol/L). Other symptoms of hypoglycemia, such as: Shaking or feeling light-headed. Confusion or irritability. Feeling hungry. Having a fast heartbeat. If you have any yellow zone symptoms: Treat your hypoglycemia by eating or drinking 15 grams of a rapid-acting carbohydrate. Follow the 15:15 rule: Take 15 grams of a rapid-acting carbohydrate, such as: 1 tube of glucose gel. 4 glucose pills. 4 oz (120 mL) of fruit juice. 4 oz (120 mL) of regular (not diet) soda. Check your blood sugar again 15 minutes after you take the carbohydrate. If the second blood sugar test is still at or below 70 mg/dL (3.9 mmol/L), take 15 grams of a carbohydrate again. If your blood sugar doesn't increase above 70 mg/dL (3.9 mmol/L) after 3 tries, get medical help right away. After your blood sugar returns to normal, eat a meal or a snack within 1 hour. Keep taking your daily medicines as told by your provider. Check your blood sugar more often than you normally would. Write down your results. Call your provider if you have trouble keeping your blood sugar in your target range. Green zone These signs mean you're doing well and can continue what you're doing to manage your diabetes: Your blood sugar is within your personal target range. For most people, a blood sugar level before a meal should be 80-130 mg/dL (0.4-5.4 mmol/L). You feel well, and you're able to do daily activities. If you're in the green zone,  continue to manage your diabetes as told by your provider. To do this: Eat a healthy diet. Exercise regularly. Check your blood sugar as told. Take your medicines only as told. Where to find more information American Diabetes Association (ADA): diabetes.org Association of Diabetes Care & Education Specialists (ADCES): adces.org/diabetes-education-dsmes This information is not intended to replace advice given to you by your health care provider.  Make sure you discuss any questions you have with your health care provider. Document Revised: 08/06/2023 Document Reviewed: 08/06/2023 Elsevier Patient Education  2024 [employer].

## 2024-08-03 NOTE — Assessment & Plan Note (Signed)
 Chronic, ongoing.  She denies SI/HI.  Recent notes reviewed. She has not scheduled with new psychiatrist, PCP will monitor medications for now and if any worsening mood will get back into psychiatry.  Will obtain Prolactin level today due to Invega  use.  Obtain EKG next visit.

## 2024-08-03 NOTE — Assessment & Plan Note (Signed)
 Chronic, ongoing.  She denies SI/HI. She has not scheduled with new psychiatrist, PCP will monitor medications for now and if any worsening mood will get back into psychiatry.

## 2024-08-03 NOTE — Assessment & Plan Note (Addendum)
 A1c trending up to 7.4% today, from 6% last visit.  Has taken Metformin  in past, will restart this at 500 MG BID.  Discussed a trial of a GLP1 which would help diabetes and weight loss, she refuses this as does not like injections.  Educated her on diabetes. Plan to send in glucometer next visit and educate her on this, was in rush today.  Recommend heavy focus on healthy diet changes and regular exercise. - Needs ACE/ARB.  Statin on board. - Foot exam up to date. Needs eye exam. - Needs PCV20 ad Shingrix , refuses today.

## 2024-08-03 NOTE — Progress Notes (Signed)
 BP 125/69   Pulse 88   Temp 98.9 F (37.2 C) (Oral)   Ht 5' 2.6 (1.59 m)   Wt 194 lb (88 kg)   LMP 03/28/2016   SpO2 98%   BMI 34.81 kg/m    Subjective:    Patient ID: Megan [name], female    DOB: 03/19/1961, 63 y.o.   MRN: [id number]  HPI: Megan [name] is a 63 y.o. female presenting on 08/03/2024 for comprehensive medical examination. Current medical complaints include: none  She currently lives with: self Menopausal Symptoms: yes  Impaired Fasting Glucose HbA1C:  Lab Results  Component Value Date   HGBA1C 7.4 (H) 08/03/2024  Duration of elevated blood sugar: chronic Polydipsia: no Polyuria: no Weight change: no Visual disturbance: no Glucose Monitoring: no    Accucheck frequency: Not Checking    Fasting glucose:     Post prandial:  Diabetic Education: Not Completed Family history of diabetes: yes   HYPERTENSION/HLD No current medications. Continues to take Rosuvastatin  daily.  Vitamin D  for low levels taken. Hypertension status: controlled  Satisfied with current treatment? yes Duration of hypertension: years BP monitoring frequency:  not checking BP range:  BP medication side effects:  no Medication compliance: no current medication regimen Aspirin: no Recurrent headaches: no Visual changes: no Palpitations: no Dyspnea: no Chest pain: no Lower extremity edema: no Dizzy/lightheaded: no  The 10-year ASCVD risk score (Arnett DK, et al., 2019) is: 16.7%   Values used to calculate the score:     Age: 67 years     Clincally relevant sex: Female     Is Non-Hispanic African American: Yes     Diabetic: Yes     Tobacco smoker: No     Systolic Blood Pressure: 125 mmHg     Is BP treated: No     HDL Cholesterol: 55 mg/dL     Total Cholesterol: 235 mg/dL  SCHIZOPHRENIA & PTSD No current psychiatrist, was not happy with last one.  Has not scheduled to see new one.  Taking Trazodone  200 MG nightly PRN, Effexor  150 MG daily, Invega  3 MG QHS.   Mood  status: stable Satisfied with current treatment?: yes Symptom severity: mild  Duration of current treatment : years Side effects: no Medication compliance: good compliance Psychotherapy/counseling:  in the past Depressed mood: no Anxious mood: no Anhedonia: no Significant weight loss or gain: no Insomnia:  hard to fall asleep but takes Trazodone  if needed Fatigue: no Feelings of worthlessness or guilt: no Impaired concentration/indecisiveness: no Suicidal ideations: no Hopelessness: no Crying spells: yes    08/03/2024    2:35 PM 05/18/2024    2:49 PM 01/30/2024    3:31 PM 01/01/2024   10:09 AM 07/30/2023    3:28 PM  Depression screen PHQ 2/9  Decreased Interest 0 0 0 0 0  Down, Depressed, Hopeless 0 3 0 1 0  PHQ - 2 Score 0 3 0 1 0  Altered sleeping 0 3 0  0  Tired, decreased energy 0 3 0  0  Change in appetite 0 0 0  0  Feeling bad or failure about yourself  0 0 0  0  Trouble concentrating 0 3 0  0  Moving slowly or fidgety/restless 0 0 0  0  Suicidal thoughts 0 0 0  0  PHQ-9 Score 0 12 0  0  Difficult doing work/chores Not difficult at all Somewhat difficult   Not difficult at all       08/03/2024  2:37 PM 05/18/2024    2:49 PM 01/30/2024    3:32 PM 07/30/2023    3:28 PM  GAD 7 : Generalized Anxiety Score  Nervous, Anxious, on Edge 0 3 0 0  Control/stop worrying 0 3 0 0  Worry too much - different things 0 3 0 0  Trouble relaxing 0 3 0 0  Restless 0 3 0 0  Easily annoyed or irritable 0 3 0 0  Afraid - awful might happen 0 3 0 0  Total GAD 7 Score 0 21 0 0  Anxiety Difficulty Not difficult at all Not difficult at all  Not difficult at all      07/30/2023    3:28 PM 01/01/2024   10:13 AM 01/30/2024    3:30 PM 05/18/2024    2:49 PM 08/03/2024    2:35 PM  Fall Risk  Falls in the past year? 0 0 0 0 0  Was there an injury with Fall? 0 0 0 0 0  Fall Risk Category Calculator 0 0 0 0 0  Patient at Risk for Falls Due to No Fall Risks No Fall Risks No Fall Risks No Fall Risks  No Fall Risks  Fall risk Follow up Falls evaluation completed Falls prevention discussed Falls evaluation completed Falls evaluation completed Falls evaluation completed    Past Medical History:  Past Medical History:  Diagnosis Date   Anxiety    Depression    Diabetes mellitus without complication (HCC)     Surgical History:  Past Surgical History:  Procedure Laterality Date   BREAST BIOPSY Right 09/11/2023   u/s bx, mass 9:30, COIL clip-path pending   BREAST BIOPSY Right 09/11/2023   US  RT BREAST BX W LOC DEV 1ST LESION IMG BX SPEC US  GUIDE 09/11/2023 [facility]   CESAREAN SECTION      Medications:  Current Outpatient Medications on File Prior to Visit  Medication Sig   cyanocobalamin 1000 MCG tablet Take 1,000 mcg by mouth daily.   meloxicam  (MOBIC ) 7.5 MG tablet Take 1 tablet (7.5 mg total) by mouth daily.   rosuvastatin  (CRESTOR ) 20 MG tablet Take 1 tablet (20 mg total) by mouth daily.   No current facility-administered medications on file prior to visit.    Allergies:  Allergies  Allergen Reactions   Sulfa Antibiotics Rash    Social History:  Social History   Socioeconomic History   Marital status: Divorced    Spouse name: Not on file   Number of children: 2   Years of education: Not on file   Highest education level: 11th grade  Occupational History   Occupation: disability  Tobacco Use   Smoking status: Never   Smokeless tobacco: Former    Types: Snuff    Quit date: 2015  Vaping Use   Vaping status: Never Used  Substance and Sexual Activity   Alcohol use: Yes    Alcohol/week: 8.0 standard drinks of alcohol    Types: 3 Cans of beer, 5 Shots of liquor per week    Comment: 2-3 mixed drinks 1-2 times per month and a 6 pack of beer every 2-3 weeks   Drug use: Not Currently    Types: Marijuana    Comment: quit 2023   Sexual activity: Yes  Other Topics Concern   Not on file  Social History Narrative   Divorced   Social Drivers of Health    Financial Resource Strain: Low Risk  (01/01/2024)   Overall Financial Resource Strain (CARDIA)  Difficulty of Paying Living Expenses: Not hard at all  Food Insecurity: No Food Insecurity (01/01/2024)   Hunger Vital Sign    Worried About Running Out of Food in the Last Year: Never true    Ran Out of Food in the Last Year: Never true  Transportation Needs: No Transportation Needs (01/01/2024)   PRAPARE - Administrator, Civil Service (Medical): No    Lack of Transportation (Non-Medical): No  Physical Activity: Inactive (01/01/2024)   Exercise Vital Sign    Days of Exercise per Week: 0 days    Minutes of Exercise per Session: 0 min  Stress: No Stress Concern Present (01/01/2024)   [employer] of Occupational Health - Occupational Stress Questionnaire    Feeling of Stress : Not at all  Social Connections: Moderately Integrated (01/01/2024)   Social Connection and Isolation Panel    Frequency of Communication with Friends and Family: More than three times a week    Frequency of Social Gatherings with Friends and Family: More than three times a week    Attends Religious Services: More than 4 times per year    Active Member of [employer] or Organizations: Yes    Attends Banker Meetings: More than 4 times per year    Marital Status: Divorced  Intimate Partner Violence: Not At Risk (01/01/2024)   Humiliation, Afraid, Rape, and Kick questionnaire    Fear of Current or Ex-Partner: No    Emotionally Abused: No    Physically Abused: No    Sexually Abused: No   Social History   Tobacco Use  Smoking Status Never  Smokeless Tobacco Former   Types: Snuff   Quit date: 2015   Social History   Substance and Sexual Activity  Alcohol Use Yes   Alcohol/week: 8.0 standard drinks of alcohol   Types: 3 Cans of beer, 5 Shots of liquor per week   Comment: 2-3 mixed drinks 1-2 times per month and a 6 pack of beer every 2-3 weeks    Family History:  Family History  Problem  Relation Age of Onset   Hypertension Mother    Alzheimer's disease Mother    Other Father        old age in his 70s   Dementia Sister    Dementia Sister    Cancer Sister    COPD Sister    Cancer Sister    Lupus Sister    Cancer Sister    Cancer Brother    Diabetes Maternal Grandmother     Past medical history, surgical history, medications, allergies, family history and social history reviewed with patient today and changes made to appropriate areas of the chart.   ROS All other ROS negative except what is listed above and in the HPI.      Objective:    BP 125/69   Pulse 88   Temp 98.9 F (37.2 C) (Oral)   Ht 5' 2.6 (1.59 m)   Wt 194 lb (88 kg)   LMP 03/28/2016   SpO2 98%   BMI 34.81 kg/m   Wt Readings from Last 3 Encounters:  08/03/24 194 lb (88 kg)  05/18/24 188 lb 3.2 oz (85.4 kg)  01/30/24 186 lb 3.2 oz (84.5 kg)    Physical Exam Vitals and nursing note reviewed. Exam conducted with a chaperone present.  Constitutional:      General: She is awake. She is not in acute distress.    Appearance: She is well-developed and well-groomed. She  is not ill-appearing or toxic-appearing.  HENT:     Head: Normocephalic and atraumatic.     Right Ear: Hearing, tympanic membrane, ear canal and external ear normal. No drainage.     Left Ear: Hearing, tympanic membrane, ear canal and external ear normal. No drainage.     Nose: Nose normal.     Right Sinus: No maxillary sinus tenderness or frontal sinus tenderness.     Left Sinus: No maxillary sinus tenderness or frontal sinus tenderness.     Mouth/Throat:     Mouth: Mucous membranes are moist.     Pharynx: Oropharynx is clear. Uvula midline. No pharyngeal swelling, oropharyngeal exudate or posterior oropharyngeal erythema.  Eyes:     General: Lids are normal.        Right eye: No discharge.        Left eye: No discharge.     Extraocular Movements: Extraocular movements intact.     Conjunctiva/sclera: Conjunctivae  normal.     Pupils: Pupils are equal, round, and reactive to light.     Visual Fields: Right eye visual fields normal and left eye visual fields normal.  Neck:     Thyroid : No thyromegaly.     Vascular: No carotid bruit.     Trachea: Trachea normal.  Cardiovascular:     Rate and Rhythm: Normal rate and regular rhythm.     Heart sounds: Normal heart sounds. No murmur heard.    No gallop.  Pulmonary:     Effort: Pulmonary effort is normal. No accessory muscle usage or respiratory distress.     Breath sounds: Normal breath sounds.  Chest:  Breasts:    Right: Normal.     Left: Normal.  Abdominal:     General: Bowel sounds are normal.     Palpations: Abdomen is soft. There is no hepatomegaly or splenomegaly.     Tenderness: There is no abdominal tenderness.  Musculoskeletal:        General: Normal range of motion.     Cervical back: Normal range of motion and neck supple.     Right lower leg: No edema.     Left lower leg: No edema.  Lymphadenopathy:     Head:     Right side of head: No submental, submandibular, tonsillar, preauricular or posterior auricular adenopathy.     Left side of head: No submental, submandibular, tonsillar, preauricular or posterior auricular adenopathy.     Cervical: No cervical adenopathy.     Upper Body:     Right upper body: No supraclavicular, axillary or pectoral adenopathy.     Left upper body: No supraclavicular, axillary or pectoral adenopathy.  Skin:    General: Skin is warm and dry.     Capillary Refill: Capillary refill takes less than 2 seconds.     Findings: No rash.  Neurological:     Mental Status: She is alert and oriented to person, place, and time.     Gait: Gait is intact.     Deep Tendon Reflexes: Reflexes are normal and symmetric.     Reflex Scores:      Brachioradialis reflexes are 2+ on the right side and 2+ on the left side.      Patellar reflexes are 2+ on the right side and 2+ on the left side. Psychiatric:        Attention  and Perception: Attention normal.        Mood and Affect: Mood normal.  Speech: Speech normal.        Behavior: Behavior normal. Behavior is cooperative.        Thought Content: Thought content normal.        Judgment: Judgment normal.    Diabetic Foot Exam - Simple   Simple Foot Form Visual Inspection No deformities, no ulcerations, no other skin breakdown bilaterally: Yes Sensation Testing Intact to touch and monofilament testing bilaterally: Yes Pulse Check Posterior Tibialis and Dorsalis pulse intact bilaterally: Yes Comments     Results for orders placed or performed in visit on 08/03/24  Bayer DCA Hb A1c Waived   Collection Time: 08/03/24  2:36 PM  Result Value Ref Range   HB A1C (BAYER DCA - WAIVED) 7.4 (H) 4.8 - 5.6 %      Assessment & Plan:   Problem List Items Addressed This Visit       Endocrine   Type 2 diabetes mellitus with obesity (HCC) - Primary   A1c trending up to 7.4% today, from 6% last visit.  Has taken Metformin  in past, will restart this at 500 MG BID.  Discussed a trial of a GLP1 which would help diabetes and weight loss, she refuses this as does not like injections.  Educated her on diabetes. Plan to send in glucometer next visit and educate her on this, was in rush today.  Recommend heavy focus on healthy diet changes and regular exercise. - Needs ACE/ARB.  Statin on board. - Foot exam up to date. Needs eye exam. - Needs PCV20 ad Shingrix , refuses today.      Relevant Medications   metFORMIN  (GLUCOPHAGE ) 500 MG tablet   Other Relevant Orders   Bayer DCA Hb A1c Waived (Completed)   Hyperlipidemia associated with type 2 diabetes mellitus (HCC)   Chronic, ongoing. Tolerating Rosuvastatin , continue this and adjust as needed.  Labs obtained today.      Relevant Medications   metFORMIN  (GLUCOPHAGE ) 500 MG tablet   Other Relevant Orders   Comprehensive metabolic panel with GFR   Lipid Panel w/o Chol/HDL Ratio     Other   Vitamin D   deficiency   Chronic, ongoing.  Recommend continue supplement weekly and recheck level today.      Relevant Orders   VITAMIN D  25 Hydroxy (Vit-D Deficiency, Fractures)   Schizophrenia, undifferentiated (HCC)   Chronic, ongoing.  She denies SI/HI.  Recent notes reviewed. She has not scheduled with new psychiatrist, PCP will monitor medications for now and if any worsening mood will get back into psychiatry.  Will obtain Prolactin level today due to Invega  use.  Obtain EKG next visit.      Relevant Orders   TSH   Prolactin   PTSD (post-traumatic stress disorder)   Chronic, ongoing.  She denies SI/HI. She has not scheduled with new psychiatrist, PCP will monitor medications for now and if any worsening mood will get back into psychiatry.      Relevant Medications   venlafaxine  XR (EFFEXOR -XR) 150 MG 24 hr capsule   traZODone  (DESYREL ) 100 MG tablet   Obesity   BMI 34.81.  Recommended eating smaller high protein, low fat meals more frequently and exercising 30 mins a day 5 times a week with a goal of 10-15lb weight loss in the next 3 months. Patient voiced their understanding and motivation to adhere to these recommendations.       Relevant Medications   metFORMIN  (GLUCOPHAGE ) 500 MG tablet   Elevated blood pressure reading   At goal in office  today.  Stopped Losartan  in past due to dizziness.  Recommend continue diet focus, with DASH diet.  Check BP at home 3 days a week and report if consistent above 130/80.  If need to start medication consider Lisinopril 2.5 MG or 5 MG, low dose to start.  Check CMP and TSH.  Urine ALB 80 July 2024, repeat next visit.       Relevant Orders   CBC with Differential/Platelet   TSH   Other Visit Diagnoses       Encounter for annual physical exam       Annual physical today with labs and health maintenance reviewed, discussed with patient.        Follow up plan: Return in about 4 weeks (around 08/31/2024) for T2DM, started Metformin  n  08/03/24.   LABORATORY TESTING:  - Pap smear: Up To Date  IMMUNIZATIONS:   - Tdap: Tetanus vaccination status reviewed: Td vaccination indicated and given today. - Influenza: Up to date - Pneumovax: Not applicable - Prevnar: Refuses - COVID: Up to date - HPV: Not applicable - Shingrix  vaccine: Refuses  SCREENING: -Mammogram: Up To Date - Colonoscopy: Up To Date, due next 2027 - Bone Density: Not applicable  -Hearing Test: Not applicable  -Spirometry: Not applicable   PATIENT COUNSELING:   Advised to take 1 mg of folate supplement per day if capable of pregnancy.   Sexuality: Discussed sexually transmitted diseases, partner selection, use of condoms, avoidance of unintended pregnancy  and contraceptive alternatives.   Advised to avoid cigarette smoking.  I discussed with the patient that most people either abstain from alcohol or drink within safe limits (<=14/week and <=4 drinks/occasion for males, <=7/weeks and <= 3 drinks/occasion for females) and that the risk for alcohol disorders and other health effects rises proportionally with the number of drinks per week and how often a drinker exceeds daily limits.  Discussed cessation/primary prevention of drug use and availability of treatment for abuse.   Diet: Encouraged to adjust caloric intake to maintain  or achieve ideal body weight, to reduce intake of dietary saturated fat and total fat, to limit sodium intake by avoiding high sodium foods and not adding table salt, and to maintain adequate dietary potassium and calcium  preferably from fresh fruits, vegetables, and low-fat dairy products.    Stressed the importance of regular exercise  Injury prevention: Discussed safety belts, safety helmets, smoke detector, smoking near bedding or upholstery.   Dental health: Discussed importance of regular tooth brushing, flossing, and dental visits.    NEXT PREVENTATIVE PHYSICAL DUE IN 1 YEAR. Return in about 4 weeks (around 08/31/2024)  for T2DM, started Metformin  n 08/03/24.

## 2024-08-03 NOTE — Assessment & Plan Note (Signed)
Chronic, ongoing.  Recommend continue supplement weekly and recheck level today.

## 2024-08-03 NOTE — Assessment & Plan Note (Signed)
 At goal in office today.  Stopped Losartan  in past due to dizziness.  Recommend continue diet focus, with DASH diet.  Check BP at home 3 days a week and report if consistent above 130/80.  If need to start medication consider Lisinopril 2.5 MG or 5 MG, low dose to start.  Check CMP and TSH.  Urine ALB 80 July 2024, repeat next visit.

## 2024-08-03 NOTE — Assessment & Plan Note (Signed)
BMI 34.81.  Recommended eating smaller high protein, low fat meals more frequently and exercising 30 mins a day 5 times a week with a goal of 10-15lb weight loss in the next 3 months. Patient voiced their understanding and motivation to adhere to these recommendations.  

## 2024-08-04 ENCOUNTER — Other Ambulatory Visit: Payer: Self-pay | Admitting: Nurse Practitioner

## 2024-08-04 ENCOUNTER — Ambulatory Visit: Payer: Self-pay | Admitting: Nurse Practitioner

## 2024-08-04 LAB — LIPID PANEL W/O CHOL/HDL RATIO
Cholesterol, Total: 206 mg/dL — ABNORMAL HIGH (ref 100–199)
HDL: 44 mg/dL (ref >39)
LDL Chol Calc (NIH): 110 mg/dL — ABNORMAL HIGH (ref 0–99)
Triglycerides: 300 mg/dL — ABNORMAL HIGH (ref 0–149)
VLDL Cholesterol Cal: 52 mg/dL — ABNORMAL HIGH (ref 5–40)

## 2024-08-04 LAB — COMPREHENSIVE METABOLIC PANEL WITH GFR
ALT: 15 IU/L (ref 0–32)
AST: 11 IU/L (ref 0–40)
Albumin: 3.7 g/dL — ABNORMAL LOW (ref 3.9–4.9)
Alkaline Phosphatase: 60 IU/L (ref 44–121)
BUN/Creatinine Ratio: 15 (ref 12–28)
BUN: 12 mg/dL (ref 8–27)
Bilirubin Total: <0.2 mg/dL (ref 0.0–1.2)
CO2: 20 mmol/L (ref 20–29)
Calcium: 8.9 mg/dL (ref 8.7–10.3)
Chloride: 104 mmol/L (ref 96–106)
Creatinine, Ser: 0.8 mg/dL (ref 0.57–1.00)
Globulin, Total: 2 g/dL (ref 1.5–4.5)
Glucose: 246 mg/dL — ABNORMAL HIGH (ref 70–99)
Potassium: 4.2 mmol/L (ref 3.5–5.2)
Sodium: 138 mmol/L (ref 134–144)
Total Protein: 5.7 g/dL — ABNORMAL LOW (ref 6.0–8.5)
eGFR: 83 mL/min/1.73 (ref >59)

## 2024-08-04 LAB — CBC WITH DIFFERENTIAL/PLATELET
Basophils Absolute: 0 x10E3/uL (ref 0.0–0.2)
Basos: 1 %
EOS (ABSOLUTE): 0.3 x10E3/uL (ref 0.0–0.4)
Eos: 4 %
Hematocrit: 37.4 % (ref 34.0–46.6)
Hemoglobin: 11.4 g/dL (ref 11.1–15.9)
Immature Grans (Abs): 0 x10E3/uL (ref 0.0–0.1)
Immature Granulocytes: 0 %
Lymphocytes Absolute: 2.1 x10E3/uL (ref 0.7–3.1)
Lymphs: 28 %
MCH: 25.5 pg — ABNORMAL LOW (ref 26.6–33.0)
MCHC: 30.5 g/dL — ABNORMAL LOW (ref 31.5–35.7)
MCV: 84 fL (ref 79–97)
Monocytes Absolute: 0.5 x10E3/uL (ref 0.1–0.9)
Monocytes: 7 %
Neutrophils Absolute: 4.7 x10E3/uL (ref 1.4–7.0)
Neutrophils: 60 %
Platelets: 354 x10E3/uL (ref 150–450)
RBC: 4.47 x10E6/uL (ref 3.77–5.28)
RDW: 13 % (ref 11.7–15.4)
WBC: 7.7 x10E3/uL (ref 3.4–10.8)

## 2024-08-04 LAB — VITAMIN D 25 HYDROXY (VIT D DEFICIENCY, FRACTURES): Vit D, 25-Hydroxy: 24 ng/mL — ABNORMAL LOW (ref 30.0–100.0)

## 2024-08-04 LAB — PROLACTIN: Prolactin: 5.4 ng/mL (ref 3.6–25.2)

## 2024-08-04 LAB — TSH: TSH: 0.513 u[IU]/mL (ref 0.450–4.500)

## 2024-08-04 NOTE — Progress Notes (Signed)
 Good afternoon, please let Megan [name] know her labs have returned: - Kidney and liver function are stable. - CBC shows no anemia.  - Glucose was elevated, which goes along with trend up in A1c.  Start the Metformin  as ordered. - Lipid panel continues to show elevations, at next visit we will discuss this and discuss starting medication to help lower levels. - Vitamin D  remains low, ensure you take your weekly supplement as ordered. - Remainder of labs stable.  Any questions? Keep being amazing!!  Thank you for allowing me to participate in your care.  I appreciate you. Kindest regards, Virgin Zellers

## 2024-08-05 NOTE — Telephone Encounter (Signed)
 Requested Prescriptions  Pending Prescriptions Disp Refills   meloxicam  (MOBIC ) 7.5 MG tablet [Pharmacy Med Name: Meloxicam  7.5MG  TABS] 90 tablet 1    Sig: TAKE 1 TABLET (7.5 MG TOTAL) BY MOUTH DAILY.     Analgesics:  COX2 Inhibitors Failed - 08/05/2024  5:49 PM      Failed - Manual Review: Labs are only required if the patient has taken medication for more than 8 weeks.      Passed - HGB in normal range and within 360 days    Hemoglobin  Date Value Ref Range Status  08/03/2024 11.4 11.1 - 15.9 g/dL Final         Passed - Cr in normal range and within 360 days    Creatinine, Ser  Date Value Ref Range Status  08/03/2024 0.80 0.57 - 1.00 mg/dL Final         Passed - HCT in normal range and within 360 days    Hematocrit  Date Value Ref Range Status  08/03/2024 37.4 34.0 - 46.6 % Final         Passed - AST in normal range and within 360 days    AST  Date Value Ref Range Status  08/03/2024 11 0 - 40 IU/L Final         Passed - ALT in normal range and within 360 days    ALT  Date Value Ref Range Status  08/03/2024 15 0 - 32 IU/L Final         Passed - eGFR is 30 or above and within 360 days    GFR calc Af Amer  Date Value Ref Range Status  06/19/2020 91 >59 mL/min/1.73 Final    Comment:    **[facility] currently reports eGFR in compliance with the current**   recommendations of the [employer]. [facility] will   update reporting as new guidelines are published from the NKF-ASN   Task force.    GFR calc non Af Amer  Date Value Ref Range Status  06/19/2020 79 >59 mL/min/1.73 Final   eGFR  Date Value Ref Range Status  08/03/2024 83 >59 mL/min/1.73 Final         Passed - Patient is not pregnant      Passed - Valid encounter within last 12 months    Recent Outpatient Visits           2 days ago Type 2 diabetes mellitus with obesity (HCC)   [facility] [place], Melanie T, NP   2 months ago Schizophrenia, undifferentiated (HCC)    [facility] [place], Melanie DASEN, NP

## 2024-09-07 ENCOUNTER — Ambulatory Visit: Admitting: Nurse Practitioner

## 2024-09-07 LAB — OPHTHALMOLOGY REPORT-SCANNED

## 2024-10-02 NOTE — Patient Instructions (Signed)

## 2024-10-06 ENCOUNTER — Encounter

## 2024-10-06 ENCOUNTER — Encounter: Payer: Self-pay | Admitting: Nurse Practitioner

## 2024-10-06 ENCOUNTER — Ambulatory Visit: Admitting: Nurse Practitioner

## 2024-10-06 VITALS — BP 137/85 | HR 67 | Temp 97.9°F | Resp 15 | Ht 62.6 in | Wt 195.2 lb

## 2024-10-06 DIAGNOSIS — Z872 Personal history of diseases of the skin and subcutaneous tissue: Secondary | ICD-10-CM

## 2024-10-06 DIAGNOSIS — Z1231 Encounter for screening mammogram for malignant neoplasm of breast: Secondary | ICD-10-CM | POA: Diagnosis not present

## 2024-10-06 DIAGNOSIS — E669 Obesity, unspecified: Secondary | ICD-10-CM

## 2024-10-06 DIAGNOSIS — E119 Type 2 diabetes mellitus without complications: Secondary | ICD-10-CM

## 2024-10-06 LAB — MICROALBUMIN, URINE WAIVED
Creatinine, Urine Waived: 300 mg/dL (ref 10–300)
Microalb, Ur Waived: 30 mg/L — ABNORMAL HIGH (ref 0–19)
Microalb/Creat Ratio: <30 mg/g (ref <30)

## 2024-10-06 NOTE — Progress Notes (Addendum)
 BP 137/85 (BP Location: Left Arm, Patient Position: Sitting, Cuff Size: Large)   Pulse 67   Temp 97.9 F (36.6 C) (Oral)   Resp 15   Ht 5' 2.6 (1.59 m)   Wt 195 lb 3.2 oz (88.5 kg)   LMP 03/28/2016   SpO2 98%   BMI 35.02 kg/m    Subjective:    Patient ID: Megan [name], female    DOB: 02-10-1961, 63 y.o.   MRN: [id number]  HPI: Megan [name] is a 63 y.o. female  Chief Complaint  Patient presents with   Type 2 diabetes    Started Metformin - No GI upsets. Doing well on it, no recent diet changes.    DIABETES Follow-up today for new diagnosis of diabetes in August 2025. A1c at time was 7.4% and was started on Metformin . Tolerating medication. Hypoglycemic episodes:no Polydipsia/polyuria: no Visual disturbance: no Chest pain: no Paresthesias: no Glucose Monitoring: no  Accucheck frequency: Not Checking  Fasting glucose:  Post prandial:  Evening:  Before meals: Taking Insulin?: no  Long acting insulin:  Short acting insulin: Blood Pressure Monitoring: not checking Retinal Examination: Not up to Date Foot Exam: Up to Date Diabetic Education: Not Completed Pneumovax: Not up to Date Influenza: Not up to Date Aspirin: no   Relevant past medical, surgical, family and social history reviewed and updated as indicated. Interim medical history since our last visit reviewed. Allergies and medications reviewed and updated.  Review of Systems  Constitutional:  Negative for activity change, appetite change, diaphoresis, fatigue and fever.  Respiratory:  Negative for cough, chest tightness and shortness of breath.   Cardiovascular:  Negative for chest pain, palpitations and leg swelling.  Gastrointestinal: Negative.   Neurological: Negative.   Psychiatric/Behavioral:  Positive for sleep disturbance. Negative for decreased concentration, self-injury and suicidal ideas. The patient is nervous/anxious.     Per HPI unless specifically indicated above     Objective:     BP 137/85 (BP Location: Left Arm, Patient Position: Sitting, Cuff Size: Large)   Pulse 67   Temp 97.9 F (36.6 C) (Oral)   Resp 15   Ht 5' 2.6 (1.59 m)   Wt 195 lb 3.2 oz (88.5 kg)   LMP 03/28/2016   SpO2 98%   BMI 35.02 kg/m   Wt Readings from Last 3 Encounters:  10/06/24 195 lb 3.2 oz (88.5 kg)  08/03/24 194 lb (88 kg)  05/18/24 188 lb 3.2 oz (85.4 kg)    Physical Exam Vitals and nursing note reviewed.  Constitutional:      General: She is awake. She is not in acute distress.    Appearance: She is well-developed and well-groomed. She is obese. She is not ill-appearing or toxic-appearing.  HENT:     Head: Normocephalic.     Right Ear: Hearing normal.     Left Ear: Hearing normal.     Nose: Nose normal.     Mouth/Throat:     Mouth: Mucous membranes are moist.  Eyes:     General: Lids are normal.        Right eye: No discharge.        Left eye: No discharge.     Conjunctiva/sclera: Conjunctivae normal.     Pupils: Pupils are equal, round, and reactive to light.  Neck:     Thyroid : No thyromegaly.     Vascular: No carotid bruit or JVD.  Cardiovascular:     Rate and Rhythm: Normal rate and regular rhythm.  Heart sounds: Normal heart sounds. No murmur heard.    No gallop.  Pulmonary:     Effort: Pulmonary effort is normal.     Breath sounds: Normal breath sounds.  Abdominal:     General: Bowel sounds are normal.     Palpations: Abdomen is soft.  Musculoskeletal:     Cervical back: Normal range of motion and neck supple.     Right lower leg: No edema.     Left lower leg: No edema.  Lymphadenopathy:     Cervical: No cervical adenopathy.  Skin:    General: Skin is warm and dry.  Neurological:     Mental Status: She is alert and oriented to person, place, and time.  Psychiatric:        Attention and Perception: Attention normal.        Mood and Affect: Mood normal.        Behavior: Behavior normal. Behavior is cooperative.        Thought Content: Thought  content normal.        Judgment: Judgment normal.    Diabetic Foot Exam - Simple   Simple Foot Form Visual Inspection No deformities, no ulcerations, no other skin breakdown bilaterally: Yes Sensation Testing Intact to touch and monofilament testing bilaterally: Yes Pulse Check Posterior Tibialis and Dorsalis pulse intact bilaterally: Yes Comments     Results for orders placed or performed in visit on 10/01/24  OPHTHALMOLOGY REPORT-SCANNED   Collection Time: 09/07/24  7:20 AM  Result Value Ref Range   HM Diabetic Eye Exam No Retinopathy No Retinopathy   A Comment        Assessment & Plan:   Problem List Items Addressed This Visit       Endocrine   Type 2 diabetes mellitus in patient with obesity (HCC) - Primary   Diagnosed on 08/03/24, started on Metformin  which she is tolerating.  Offered equipment to check blood sugar, she does not wish to do this. Recommended intermittent checks. At this time will continue current medication regimen and adjust as needed. - Foot and eye exams up to date - Needs ACE/ARB, has statin on board - Urine ALB today on labs - Refuses vaccines.      Relevant Orders   Microalbumin, Urine Waived     Other   History of cyst of breast   Relevant Orders   MM 3D DIAGNOSTIC MAMMOGRAM BILATERAL BREAST W/IMPLANT   Other Visit Diagnoses       Encounter for screening mammogram for malignant neoplasm of breast       Mammogram ordered   Relevant Orders   MM 3D DIAGNOSTIC MAMMOGRAM BILATERAL BREAST W/IMPLANT        Follow up plan: Return in about 2 months (around 12/06/2024) for T2DM, HLD, MOOD.

## 2024-10-06 NOTE — Assessment & Plan Note (Addendum)
 Diagnosed on 08/03/24, started on Metformin  which she is tolerating.  Offered equipment to check blood sugar, she does not wish to do this. Recommended intermittent checks. At this time will continue current medication regimen and adjust as needed. - Foot and eye exams up to date - Needs ACE/ARB, has statin on board - Urine ALB today on labs - Refuses vaccines.

## 2024-10-11 NOTE — Progress Notes (Signed)
 Megan [name]                                          MRN: [id number]   10/11/2024   The [facility] Quality Team Specialist reviewed this patient medical record for the purposes of chart review for care gap closure. The following were reviewed: abstraction for care gap closure-kidney health evaluation for diabetes:eGFR  and uACR.    [facility]BCI Quality Team

## 2024-10-11 NOTE — Progress Notes (Signed)
 Megan [name]                                          MRN: [id number]   10/11/2024   The [facility]BC[facility] Quality Team Specialist reviewed this patient medical record for the purposes of chart review for care gap closure. The following were reviewed: chart review for care gap closure-diabetic eye exam.  Noncompliant retinal exam, left eye image quality unable to be interpreted.    [facility]BCI Quality Team

## 2024-10-18 ENCOUNTER — Other Ambulatory Visit: Payer: Self-pay | Admitting: Nurse Practitioner

## 2024-10-18 DIAGNOSIS — Z1231 Encounter for screening mammogram for malignant neoplasm of breast: Secondary | ICD-10-CM

## 2024-10-22 ENCOUNTER — Inpatient Hospital Stay: Admission: RE | Admit: 2024-10-22 | Source: Ambulatory Visit

## 2024-11-16 ENCOUNTER — Other Ambulatory Visit: Payer: Self-pay | Admitting: Nurse Practitioner

## 2024-11-18 NOTE — Telephone Encounter (Signed)
 Requested medications are due for refill today.  yes  Requested medications are on the active medications list.  yes  Last refill. 08/13/2024 #90 1 rf  Future visit scheduled.   yes  Notes to clinic.  Rx written to expire 12/01/2024    Requested Prescriptions  Pending Prescriptions Disp Refills   traZODone  (DESYREL ) 100 MG tablet [Pharmacy Med Name: traZODone  HCl 100MG  TABS*] 60 tablet     Sig: Take 2 tablets (200 mg total) by mouth at bedtime as needed for sleep.     Psychiatry: Antidepressants - Serotonin Modulator Passed - 11/18/2024  5:35 PM      Passed - Valid encounter within last 6 months    Recent Outpatient Visits           1 month ago Type 2 diabetes mellitus in patient with obesity (HCC)   [place] [facility] [place], [place] T, NP   3 months ago Type 2 diabetes mellitus with obesity   [facility] [place], [place] T, NP   6 months ago Schizophrenia, undifferentiated [facility])   [place] [facility] [place], Melanie DASEN, NP

## 2024-11-29 ENCOUNTER — Ambulatory Visit
Admission: RE | Admit: 2024-11-29 | Discharge: 2024-11-29 | Disposition: A | Source: Ambulatory Visit | Attending: Nurse Practitioner | Admitting: Nurse Practitioner

## 2024-11-29 DIAGNOSIS — Z1231 Encounter for screening mammogram for malignant neoplasm of breast: Secondary | ICD-10-CM

## 2024-12-08 ENCOUNTER — Ambulatory Visit: Admitting: Nurse Practitioner

## 2024-12-08 ENCOUNTER — Telehealth: Payer: Self-pay | Admitting: Nurse Practitioner

## 2024-12-08 DIAGNOSIS — F203 Undifferentiated schizophrenia: Secondary | ICD-10-CM

## 2024-12-08 DIAGNOSIS — F4310 Post-traumatic stress disorder, unspecified: Secondary | ICD-10-CM

## 2024-12-08 DIAGNOSIS — E1169 Type 2 diabetes mellitus with other specified complication: Secondary | ICD-10-CM

## 2024-12-08 DIAGNOSIS — R030 Elevated blood-pressure reading, without diagnosis of hypertension: Secondary | ICD-10-CM

## 2024-12-08 DIAGNOSIS — E669 Obesity, unspecified: Secondary | ICD-10-CM

## 2024-12-08 DIAGNOSIS — E66811 Obesity, class 1: Secondary | ICD-10-CM

## 2024-12-08 NOTE — Telephone Encounter (Signed)
-----   Message from Megan [name] sent at 12/08/2024  3:28 PM EST ----- Please call to reschedule, missed A1c recheck.

## 2024-12-08 NOTE — Telephone Encounter (Signed)
 Called patient and left a message to call back to get scheduled.

## 2025-01-10 NOTE — Progress Notes (Signed)
 LEIMOMI ZERVAS                                          MRN: [id number]   01/10/2025   The [facility]BCI Quality Team Specialist reviewed this patient medical record for the purposes of chart review for care gap closure. The following were reviewed: abstraction for care gap closure-kidney health evaluation for diabetes:eGFR  and uACR.    VBCI Quality Team

## 2025-01-13 ENCOUNTER — Ambulatory Visit: Payer: Self-pay | Admitting: Emergency Medicine

## 2025-01-13 VITALS — BP 122/82 | Ht 62.5 in | Wt 195.6 lb

## 2025-01-13 DIAGNOSIS — Z23 Encounter for immunization: Secondary | ICD-10-CM | POA: Diagnosis not present

## 2025-01-13 DIAGNOSIS — Z0000 Encounter for general adult medical examination without abnormal findings: Secondary | ICD-10-CM

## 2025-01-13 NOTE — Progress Notes (Signed)
 "  Chief Complaint  Patient presents with   Medicare Wellness     Subjective:   Megan [name] is a 64 y.o. female who presents for a Medicare Annual Wellness Visit.  Visit info / Clinical Intake: Medicare Wellness Visit Type:: Subsequent Annual Wellness Visit Persons participating in visit and providing information:: patient Medicare Wellness Visit Mode:: In-person (required for WTM) Interpreter Needed?: No Pre-visit prep was completed: yes AWV questionnaire completed by patient prior to visit?: no Living arrangements:: with family/others Patient's Overall Health Status Rating: excellent Typical amount of pain: some Does pain affect daily life?: (!) yes Are you currently prescribed opioids?: no  Dietary Habits and Nutritional Risks How many meals a day?: 4 Eats fruit and vegetables daily?: yes Most meals are obtained by: preparing own meals In the last 2 weeks, have you had any of the following?: none Diabetic:: (!) yes Any non-healing wounds?: no How often do you check your BS?: -- (once per week) Would you like to be referred to a Nutritionist or for Diabetic Management? : no  Functional Status Activities of Daily Living (to include ambulation/medication): Independent Ambulation: Independent with device- listed below Home Assistive Devices/Equipment: Eyeglasses (glasses for reading sometimes) Medication Administration: Independent Home Management (perform basic housework or laundry): Independent Manage your own finances?: yes Primary transportation is: driving Concerns about vision?: no *vision screening is required for WTM* Concerns about hearing?: no  Fall Screening Falls in the past year?: 0 Number of falls in past year: 0 Was there an injury with Fall?: 0 Fall Risk Category Calculator: 0 Patient Fall Risk Level: Low Fall Risk  Fall Risk Patient at Risk for Falls Due to: No Fall Risks Fall risk Follow up: Falls evaluation completed  Home and  Transportation Safety: All rugs have non-skid backing?: (!) no All stairs or steps have railings?: (!) no (1 step without handrails) Grab bars in the bathtub or shower?: (!) no Have non-skid surface in bathtub or shower?: yes Good home lighting?: yes Regular seat belt use?: yes Hospital stays in the last year:: no  Cognitive Assessment Difficulty concentrating, remembering, or making decisions? : no Will 6CIT or Mini Cog be Completed: yes What year is it?: 0 points What month is it?: 0 points Give patient an address phrase to remember (5 components): [address] [place] About what time is it?: 0 points Count backwards from 20 to 1: 0 points Say the months of the year in reverse: 0 points Repeat the address phrase from earlier: 0 points 6 CIT Score: 0 points  Advance Directives (For Healthcare) Does Patient Have a Medical Advance Directive?: No Would patient like information on creating a medical advance directive?: No - Patient declined  Reviewed/Updated  Reviewed/Updated: Reviewed All (Medical, Surgical, Family, Medications, Allergies, Care Teams, Patient Goals)    Allergies (verified) Sulfa antibiotics   Current Medications (verified) Outpatient Encounter Medications as of 01/13/2025  Medication Sig   cyanocobalamin 1000 MCG tablet Take 1,000 mcg by mouth daily.   ferrous sulfate 325 (65 FE) MG EC tablet Take 325 mg by mouth daily. (Patient taking differently: Take 325 mg by mouth daily. Taking 3-4 times per week)   meloxicam  (MOBIC ) 7.5 MG tablet TAKE 1 TABLET (7.5 MG TOTAL) BY MOUTH DAILY.   metFORMIN  (GLUCOPHAGE ) 500 MG tablet Take 1 tablet (500 mg total) by mouth 2 (two) times daily with a meal.   paliperidone  (INVEGA ) 3 MG 24 hr tablet Take 1 tablet (3 mg total) by mouth daily.  rosuvastatin  (CRESTOR ) 20 MG tablet Take 1 tablet (20 mg total) by mouth daily.   traZODone  (DESYREL ) 100 MG tablet TAKE 2 TABLETS (200 MG TOTAL) BY MOUTH AT BEDTIME AS NEEDED FOR SLEEP.    venlafaxine  XR (EFFEXOR -XR) 150 MG 24 hr capsule Take 1 capsule (150 mg total) by mouth daily with breakfast.   Vitamin D , Ergocalciferol , (DRISDOL ) 1.25 MG (50000 UNIT) CAPS capsule Take 1 capsule (50,000 Units total) by mouth every 7 (seven) days.   No facility-administered encounter medications on file as of 01/13/2025.    History: Past Medical History:  Diagnosis Date   Anxiety    Depression    Diabetes mellitus without complication (HCC)    Past Surgical History:  Procedure Laterality Date   BREAST BIOPSY Right 09/11/2023   u/s bx, mass 9:30, COIL clip-path pending   BREAST BIOPSY Right 09/11/2023   US  RT BREAST BX W LOC DEV 1ST LESION IMG BX SPEC US  GUIDE 09/11/2023 [facility]   CESAREAN SECTION     Family History  Problem Relation Age of Onset   Hypertension Mother    Alzheimer's disease Mother    Other Father        old age in his 44s   Dementia Sister    Dementia Sister    Cancer Sister    COPD Sister    Cancer Sister    Lupus Sister    Cancer Sister    Cancer Brother        prostate cancer   Cancer Brother        prostate cancer   Diabetes Maternal Grandmother    Social History   Occupational History   Occupation: disability  Tobacco Use   Smoking status: Never   Smokeless tobacco: Former    Types: Snuff    Quit date: 2015  Vaping Use   Vaping status: Never Used  Substance and Sexual Activity   Alcohol use: Yes    Alcohol/week: 8.0 standard drinks of alcohol    Types: 3 Cans of beer, 5 Shots of liquor per week    Comment: 2-3 mixed drinks 1-2 times per month and a 6 pack of beer every 2-3 weeks   Drug use: Not Currently    Types: Marijuana    Comment: quit 2023   Sexual activity: Yes   Tobacco Counseling Counseling given: Not Answered  SDOH Screenings   Food Insecurity: No Food Insecurity (01/13/2025)  Housing: Low Risk (01/13/2025)  Transportation Needs: No Transportation Needs (01/13/2025)  Utilities: Not At Risk (01/13/2025)   Alcohol Screen: Low Risk (01/01/2024)  Depression (PHQ2-9): Low Risk (01/13/2025)  Financial Resource Strain: Low Risk (01/01/2024)  Physical Activity: Insufficiently Active (01/13/2025)  Social Connections: Moderately Integrated (01/13/2025)  Stress: No Stress Concern Present (01/13/2025)  Tobacco Use: Medium Risk (01/13/2025)  Health Literacy: Adequate Health Literacy (01/13/2025)   See flowsheets for full screening details  Depression Screen PHQ 2 & 9 Depression Scale- Over the past 2 weeks, how often have you been bothered by any of the following problems? Little interest or pleasure in doing things: 0 Feeling down, depressed, or hopeless (PHQ Adolescent also includes...irritable): 1 (recent death of brother) PHQ-2 Total Score: 1 Trouble falling or staying asleep, or sleeping too much: 0 Feeling tired or having little energy: 0 Poor appetite or overeating (PHQ Adolescent also includes...weight loss): 0 Feeling bad about yourself - or that you are a failure or have let yourself or your family down: 0 Trouble concentrating on things, such as reading the  newspaper or watching television [facility] Adolescent also includes...like school work): 0 Moving or speaking so slowly that other people could have noticed. Or the opposite - being so fidgety or restless that you have been moving around a lot more than usual: 0 Thoughts that you would be better off dead, or of hurting yourself in some way: 0 PHQ-9 Total Score: 1 If you checked off any problems, how difficult have these problems made it for you to do your work, take care of things at home, or get along with other people?: Not difficult at all  Depression Treatment Depression Interventions/Treatment : EYV7-0 Score <4 Follow-up Not Indicated; Currently on Treatment     Goals Addressed             This Visit's Progress    Exercise 3x per week (30 min per time)   Not on track            Objective:    Today's Vitals   01/13/25 1011   BP: 122/82  Weight: 195 lb 9.6 oz (88.7 kg)  Height: 5' 2.5 (1.588 m)   Body mass index is 35.21 kg/m.  Hearing/Vision screen Hearing Screening - Comments:: Denies hearing loss  Vision Screening - Comments:: Needs DM eye exam. Patient to call [facility] Immunizations and Health Maintenance Health Maintenance  Topic Date Due   COVID-19 Vaccine (4 - 2025-26 season) 08/30/2024   Zoster Vaccines- Shingrix  (1 of 2) 02/26/2025 (Originally 01/01/1980)   Influenza Vaccine  03/29/2025 (Originally 07/30/2024)   Pneumococcal Vaccine: 50+ Years (1 of 2 - PCV) 08/03/2025 (Originally [birth year]01/1980)   HEMOGLOBIN A1C  02/03/2025   Diabetic kidney evaluation - eGFR measurement  08/03/2025   OPHTHALMOLOGY EXAM  09/07/2025   Diabetic kidney evaluation - Urine ACR  10/06/2025   FOOT EXAM  10/06/2025   Mammogram  11/29/2025   Medicare Annual Wellness (AWV)  01/13/2026   Fecal DNA (Cologuard)  08/10/2026   Cervical Cancer Screening (HPV/Pap Cotest)  07/20/2027   DTaP/Tdap/Td (2 - Tdap) 07/19/2032   HPV VACCINES (No Doses Required) Completed   Hepatitis C Screening  Completed   HIV Screening  Completed   Hepatitis B Vaccines 19-59 Average Risk  Aged Out   Meningococcal B Vaccine  Aged Out        Assessment/Plan:  This is a routine wellness examination for Kamarah.  Patient Care Team: Valerio Melanie DASEN, NP as PCP - General (Nurse Practitioner) Vickey Mettle, MD as Consulting Physician (Psychiatry)  I have personally reviewed and noted the following in the patients chart:   Medical and social history Use of alcohol, tobacco or illicit drugs  Current medications and supplements including opioid prescriptions. Functional ability and status Nutritional status Physical activity Advanced directives List of other physicians Hospitalizations, surgeries, and ER visits in previous 12 months Vitals Screenings to include cognitive, depression, and falls Referrals and appointments  No orders of the  defined types were placed in this encounter.  In addition, I have reviewed and discussed with patient certain preventive protocols, quality metrics, and best practice recommendations. A written personalized care plan for preventive services as well as general preventive health recommendations were provided to patient.   Vina Ned, CMA   01/13/2025   Return in 1 year (on 01/17/2026) for Medicare Annual Wellness Visit.  After Visit Summary: (In Person-Printed) AVS printed and given to the patient  Nurse Notes:  Needs DM eye exam. Patient to call [facility] and schedule Needs Shingles vaccine (pharmacy) Patient wants  pneunomia, flu and covid vaccines at next OV Patient to RS no show appt from 12/08/24 Declined DM & Nutrition education referral  "

## 2025-01-13 NOTE — Patient Instructions (Signed)
 Ms. Cavey,  Thank you for taking the time for your Medicare Wellness Visit. I appreciate your continued commitment to your health goals. Please review the care plan we discussed, and feel free to reach out if I can assist you further.  Please note that Annual Wellness Visits do not include a physical exam. Some assessments may be limited, especially if the visit was conducted virtually. If needed, we may recommend an in-person follow-up with your provider.  Ongoing Care Seeing your primary care provider every 3 to 6 months helps us  monitor your health and provide consistent, personalized care.   Referrals If a referral was made during today's visit and you haven't received any updates within two weeks, please contact the referred provider directly to check on the status.  Recommended Screenings:  You need a diabetic eye exam. Call [facility] at your earliest convenience. You should have an eye exam every year.   Health Maintenance  Topic Date Due   COVID-19 Vaccine (4 - 2025-26 season) 08/30/2024   Medicare Annual Wellness Visit  12/31/2024   Zoster (Shingles) Vaccine (1 of 2) 02/26/2025*   Flu Shot  03/29/2025*   Pneumococcal Vaccine for age over 65 (1 of 2 - PCV) 08/03/2025*   Hemoglobin A1C  02/03/2025   Yearly kidney function blood test for diabetes  08/03/2025   Eye exam for diabetics  09/07/2025   Yearly kidney health urinalysis for diabetes  10/06/2025   Complete foot exam   10/06/2025   Breast Cancer Screening  11/29/2025   Cologuard (Stool DNA test)  08/10/2026   Pap with HPV screening  07/20/2027   DTaP/Tdap/Td vaccine (2 - Tdap) 07/19/2032   HPV Vaccine (No Doses Required) Completed   Hepatitis C Screening  Completed   HIV Screening  Completed   Hepatitis B Vaccine  Aged Out   Meningitis B Vaccine  Aged Out  *Topic was postponed. The date shown is not the original due date.       01/13/2025   10:17 AM  Advanced Directives  Does Patient Have a Medical Advance  Directive? No  Would patient like information on creating a medical advance directive? No - Patient declined    Vision: Annual vision screenings are recommended for early detection of glaucoma, cataracts, and diabetic retinopathy. These exams can also reveal signs of chronic conditions such as diabetes and high blood pressure.  Dental: Annual dental screenings help detect early signs of oral cancer, gum disease, and other conditions linked to overall health, including heart disease and diabetes.  Please see the attached documents for additional preventive care recommendations.

## 2025-01-22 NOTE — Patient Instructions (Incomplete)
 Be Involved in Caring For Your Health:  Taking Medications When medications are taken as directed, they can greatly improve your health. But if they are not taken as prescribed, they may not work. In some cases, not taking them correctly can be harmful. To help ensure your treatment remains effective and safe, understand your medications and how to take them. Bring your medications to each visit for review by your provider.  Your lab results, notes, and after visit summary will be available on My Chart. We strongly encourage you to use this feature. If lab results are abnormal the clinic will contact you with the appropriate steps. If the clinic does not contact you assume the results are satisfactory. You can always view your results on My Chart. If you have questions regarding your health or results, please contact the clinic during office hours. You can also ask questions on My Chart.  We at [facility] are grateful that you chose [place] to provide your care. We strive to provide evidence-based and compassionate care and are always looking for feedback. If you get a survey from the clinic please complete this so we can hear your opinions.  Diabetes Mellitus and Foot Care Diabetes, also called diabetes mellitus, may cause problems with your feet and legs because of poor blood flow (circulation). Poor circulation may make your skin: Become thinner and drier. Break more easily. Heal more slowly. Peel and crack. You may also have nerve damage (neuropathy). This can cause decreased feeling in your legs and feet. This means that you may not notice minor injuries to your feet that could lead to more serious problems. Finding and treating problems early is the best way to prevent future foot problems. How to care for your feet Foot hygiene  Wash your feet daily with warm water and mild soap. Do not use hot water. Then, pat your feet and the areas between your toes until they are fully dry. Do  not soak your feet. This can dry your skin. Trim your toenails straight across. Do not dig under them or around the cuticle. File the edges of your nails with an emery board or nail file. Apply a moisturizing lotion or petroleum jelly to the skin on your feet and to dry, brittle toenails. Use lotion that does not contain alcohol and is unscented. Do not apply lotion between your toes. Shoes and socks Wear clean socks or stockings every day. Make sure they are not too tight. Do not wear knee-high stockings. These may decrease blood flow to your legs. Wear shoes that fit well and have enough cushioning. Always look in your shoes before you put them on to be sure there are no objects inside. To break in new shoes, wear them for just a few hours a day. This prevents injuries on your feet. Wounds, scrapes, corns, and calluses  Check your feet daily for blisters, cuts, bruises, sores, and redness. If you cannot see the bottom of your feet, use a mirror or ask someone for help. Do not cut off corns or calluses or try to remove them with medicine. If you find a minor scrape, cut, or break in the skin on your feet, keep it and the skin around it clean and dry. You may clean these areas with mild soap and water. Do not clean the area with peroxide, alcohol, or iodine. If you have a wound, scrape, corn, or callus on your foot, look at it several times a day to make sure it  is healing and not infected. Check for: Redness, swelling, or pain. Fluid or blood. Warmth. Pus or a bad smell. General tips Do not cross your legs. This may decrease blood flow to your feet. Do not use heating pads or hot water bottles on your feet. They may burn your skin. If you have lost feeling in your feet or legs, you may not know this is happening until it is too late. Protect your feet from hot and cold by wearing shoes, such as at the beach or on hot pavement. Schedule a complete foot exam at least once a year or more often if  you have foot problems. Report any cuts, sores, or bruises to your health care provider right away. Where to find more information American Diabetes Association: diabetes.org Association of Diabetes Care & Education Specialists: diabeteseducator.org Contact a health care provider if: You have a condition that increases your risk of infection, and you have any cuts, sores, or bruises on your feet. You have an injury that is not healing. You have redness on your legs or feet. You feel burning or tingling in your legs or feet. You have pain or cramps in your legs and feet. Your legs or feet are numb. Your feet always feel cold. You have pain around any toenails. Get help right away if: You have a wound, scrape, corn, or callus on your foot and: You have signs of infection. You have a fever. You have a red line going up your leg. This information is not intended to replace advice given to you by your health care provider. Make sure you discuss any questions you have with your health care provider. Document Revised: 06/19/2022 Document Reviewed: 06/19/2022 Elsevier Patient Education  2024 [employer].

## 2025-01-24 ENCOUNTER — Other Ambulatory Visit: Payer: Self-pay | Admitting: Nurse Practitioner

## 2025-01-25 NOTE — Telephone Encounter (Signed)
 Requested Prescriptions  Pending Prescriptions Disp Refills   rosuvastatin  (CRESTOR ) 20 MG tablet [Pharmacy Med Name: Rosuvastatin  Calcium  20MG  TABS] 90 tablet 1    Sig: TAKE 1 TABLET DAILY     Cardiovascular:  Antilipid - Statins 2 Failed - 01/25/2025  3:23 PM      Failed - Lipid Panel in normal range within the last 12 months    Cholesterol, Total  Date Value Ref Range Status  08/03/2024 206 (H) 100 - 199 mg/dL Final   LDL Chol Calc (NIH)  Date Value Ref Range Status  08/03/2024 110 (H) 0 - 99 mg/dL Final   HDL  Date Value Ref Range Status  08/03/2024 44 >39 mg/dL Final   Triglycerides  Date Value Ref Range Status  08/03/2024 300 (H) 0 - 149 mg/dL Final         Passed - Cr in normal range and within 360 days    Creatinine, Ser  Date Value Ref Range Status  08/03/2024 0.80 0.57 - 1.00 mg/dL Final         Passed - Patient is not pregnant      Passed - Valid encounter within last 12 months    Recent Outpatient Visits           3 months ago Type 2 diabetes mellitus in patient with obesity (HCC)   [place] [facility] [place], [place] T, NP   5 months ago Type 2 diabetes mellitus with obesity   [place] [facility] [place], [place] T, NP   8 months ago Schizophrenia, undifferentiated (HCC)   [place] [facility] [place], Melanie DASEN, NP

## 2025-01-26 ENCOUNTER — Ambulatory Visit: Admitting: Nurse Practitioner

## 2025-01-26 DIAGNOSIS — F203 Undifferentiated schizophrenia: Secondary | ICD-10-CM

## 2025-01-26 DIAGNOSIS — E66811 Obesity, class 1: Secondary | ICD-10-CM

## 2025-01-26 DIAGNOSIS — E669 Obesity, unspecified: Secondary | ICD-10-CM

## 2025-01-26 DIAGNOSIS — F4310 Post-traumatic stress disorder, unspecified: Secondary | ICD-10-CM

## 2025-01-26 DIAGNOSIS — R030 Elevated blood-pressure reading, without diagnosis of hypertension: Secondary | ICD-10-CM

## 2025-01-26 DIAGNOSIS — E785 Hyperlipidemia, unspecified: Secondary | ICD-10-CM

## 2025-02-02 ENCOUNTER — Other Ambulatory Visit: Payer: Self-pay | Admitting: Nurse Practitioner

## 2025-02-04 NOTE — Telephone Encounter (Signed)
 Requested medication (s) are due for refill today: yes  Requested medication (s) are on the active medication list: yes  Last refill:  08/03/24  Future visit scheduled: yes  Notes to clinic:  Unable to refill per protocol, cannot delegate.      Requested Prescriptions  Pending Prescriptions Disp Refills   paliperidone  (INVEGA ) 3 MG 24 hr tablet [Pharmacy Med Name: Paliperidone  ER 3MG  TB24] 30 tablet     Sig: Take 1 tablet (3 mg total) by mouth daily.     Not Delegated - Psychiatry: Antipsychotics - Second Generation (Atypical) - paliperidone  Failed - 02/04/2025  8:55 AM      Failed - This refill cannot be delegated      Failed - Lipid Panel in normal range within the last 12 months    Cholesterol, Total  Date Value Ref Range Status  08/03/2024 206 (H) 100 - 199 mg/dL Final   LDL Chol Calc (NIH)  Date Value Ref Range Status  08/03/2024 110 (H) 0 - 99 mg/dL Final   HDL  Date Value Ref Range Status  08/03/2024 44 >39 mg/dL Final   Triglycerides  Date Value Ref Range Status  08/03/2024 300 (H) 0 - 149 mg/dL Final         Passed - TSH in normal range and within 360 days    TSH  Date Value Ref Range Status  08/03/2024 0.513 0.450 - 4.500 uIU/mL Final         Passed - Completed PHQ-2 or PHQ-9 in the last 360 days      Passed - Last BP in normal range    BP Readings from Last 1 Encounters:  01/13/25 122/82         Passed - Last Heart Rate in normal range    Pulse Readings from Last 1 Encounters:  10/06/24 67         Passed - Valid encounter within last 6 months    Recent Outpatient Visits           4 months ago Type 2 diabetes mellitus in patient with obesity (HCC)   [place] [facility] [place], [place] T, NP   6 months ago Type 2 diabetes mellitus with obesity   [place] [facility] [place], [place] T, NP   8 months ago Schizophrenia, undifferentiated (HCC)   [place] [facility] [place], [place] T, NP               Passed - CBC within normal limits and completed in the last 12 months    WBC  Date Value Ref Range Status  08/03/2024 7.7 3.4 - 10.8 x10E3/uL Final  07/31/2019 6.3 4.0 - 10.5 K/uL Final   RBC  Date Value Ref Range Status  08/03/2024 4.47 3.77 - 5.28 x10E6/uL Final  07/31/2019 4.62 3.87 - 5.11 MIL/uL Final   Hemoglobin  Date Value Ref Range Status  08/03/2024 11.4 11.1 - 15.9 g/dL Final   Hematocrit  Date Value Ref Range Status  08/03/2024 37.4 34.0 - 46.6 % Final   MCHC  Date Value Ref Range Status  08/03/2024 30.5 (L) 31.5 - 35.7 g/dL Final  91/98/7979 67.6 30.0 - 36.0 g/dL Final   [facility]  Date Value Ref Range Status  08/03/2024 25.5 (L) 26.6 - 33.0 pg Final  07/31/2019 27.3 26.0 - 34.0 pg Final   MCV  Date Value Ref Range Status  08/03/2024 84 79 - 97 fL Final   No results found for:  PLTCOUNTKUC, LABPLAT, POCPLA RDW  Date Value Ref Range Status  08/03/2024 13.0 11.7 - 15.4 % Final         Passed - CMP within normal limits and completed in the last 12 months    Albumin  Date Value Ref Range Status  08/03/2024 3.7 (L) 3.9 - 4.9 g/dL Final   Alkaline Phosphatase  Date Value Ref Range Status  08/03/2024 60 44 - 121 IU/L Final   ALT  Date Value Ref Range Status  08/03/2024 15 0 - 32 IU/L Final   AST  Date Value Ref Range Status  08/03/2024 11 0 - 40 IU/L Final   BUN  Date Value Ref Range Status  08/03/2024 12 8 - 27 mg/dL Final   Calcium   Date Value Ref Range Status  08/03/2024 8.9 8.7 - 10.3 mg/dL Final   CO2  Date Value Ref Range Status  08/03/2024 20 20 - 29 mmol/L Final   Creatinine, Ser  Date Value Ref Range Status  08/03/2024 0.80 0.57 - 1.00 mg/dL Final   Glucose  Date Value Ref Range Status  08/03/2024 246 (H) 70 - 99 mg/dL Final   Glucose, Bld  Date Value Ref Range Status  07/31/2019 131 (H) 70 - 99 mg/dL Final   Potassium  Date Value Ref Range Status  08/03/2024 4.2 3.5 - 5.2 mmol/L Final   Sodium  Date Value Ref  Range Status  08/03/2024 138 134 - 144 mmol/L Final   Bilirubin Total  Date Value Ref Range Status  08/03/2024 <0.2 0.0 - 1.2 mg/dL Final   Protein, ur  Date Value Ref Range Status  07/31/2019 NEGATIVE NEGATIVE mg/dL Final   Protein,UA  Date Value Ref Range Status  06/18/2022 Negative Negative/Trace Final   Total Protein  Date Value Ref Range Status  08/03/2024 5.7 (L) 6.0 - 8.5 g/dL Final   GFR calc Af Amer  Date Value Ref Range Status  06/19/2020 91 >59 mL/min/1.73 Final    Comment:    **[facility] currently reports eGFR in compliance with the current**   recommendations of the [employer]. [facility] will   update reporting as new guidelines are published from the NKF-ASN   Task force.    eGFR  Date Value Ref Range Status  08/03/2024 83 >59 mL/min/1.73 Final   GFR calc non Af Amer  Date Value Ref Range Status  06/19/2020 79 >59 mL/min/1.73 Final

## 2025-02-04 NOTE — Telephone Encounter (Signed)
 Requested medication (s) are due for refill today: yes  Requested medication (s) are on the active medication list: yes  Last refill:  08/03/24  Future visit scheduled: yes  Notes to clinic:  Unable to refill per protocol, cannot delegate.      Requested Prescriptions  Pending Prescriptions Disp Refills   paliperidone  (INVEGA ) 3 MG 24 hr tablet [Pharmacy Med Name: Paliperidone  ER 3MG  TB24] 30 tablet     Sig: Take 1 tablet (3 mg total) by mouth daily.     Not Delegated - Psychiatry: Antipsychotics - Second Generation (Atypical) - paliperidone  Failed - 02/04/2025  9:11 AM      Failed - This refill cannot be delegated      Failed - Lipid Panel in normal range within the last 12 months    Cholesterol, Total  Date Value Ref Range Status  08/03/2024 206 (H) 100 - 199 mg/dL Final   LDL Chol Calc (NIH)  Date Value Ref Range Status  08/03/2024 110 (H) 0 - 99 mg/dL Final   HDL  Date Value Ref Range Status  08/03/2024 44 >39 mg/dL Final   Triglycerides  Date Value Ref Range Status  08/03/2024 300 (H) 0 - 149 mg/dL Final         Passed - TSH in normal range and within 360 days    TSH  Date Value Ref Range Status  08/03/2024 0.513 0.450 - 4.500 uIU/mL Final         Passed - Completed PHQ-2 or PHQ-9 in the last 360 days      Passed - Last BP in normal range    BP Readings from Last 1 Encounters:  01/13/25 122/82         Passed - Last Heart Rate in normal range    Pulse Readings from Last 1 Encounters:  10/06/24 67         Passed - Valid encounter within last 6 months    Recent Outpatient Visits           4 months ago Type 2 diabetes mellitus in patient with obesity (HCC)   [place] [facility] [place], [place] T, NP   6 months ago Type 2 diabetes mellitus with obesity   [place] [facility] [place], [place] T, NP   8 months ago Schizophrenia, undifferentiated (HCC)   [place] [facility] [place], [place] T, NP               Passed - CBC within normal limits and completed in the last 12 months    WBC  Date Value Ref Range Status  08/03/2024 7.7 3.4 - 10.8 x10E3/uL Final  07/31/2019 6.3 4.0 - 10.5 K/uL Final   RBC  Date Value Ref Range Status  08/03/2024 4.47 3.77 - 5.28 x10E6/uL Final  07/31/2019 4.62 3.87 - 5.11 MIL/uL Final   Hemoglobin  Date Value Ref Range Status  08/03/2024 11.4 11.1 - 15.9 g/dL Final   Hematocrit  Date Value Ref Range Status  08/03/2024 37.4 34.0 - 46.6 % Final   MCHC  Date Value Ref Range Status  08/03/2024 30.5 (L) 31.5 - 35.7 g/dL Final  91/98/7979 67.6 30.0 - 36.0 g/dL Final   [facility]  Date Value Ref Range Status  08/03/2024 25.5 (L) 26.6 - 33.0 pg Final  07/31/2019 27.3 26.0 - 34.0 pg Final   MCV  Date Value Ref Range Status  08/03/2024 84 79 - 97 fL Final   No results found for:  PLTCOUNTKUC, LABPLAT, POCPLA RDW  Date Value Ref Range Status  08/03/2024 13.0 11.7 - 15.4 % Final         Passed - CMP within normal limits and completed in the last 12 months    Albumin  Date Value Ref Range Status  08/03/2024 3.7 (L) 3.9 - 4.9 g/dL Final   Alkaline Phosphatase  Date Value Ref Range Status  08/03/2024 60 44 - 121 IU/L Final   ALT  Date Value Ref Range Status  08/03/2024 15 0 - 32 IU/L Final   AST  Date Value Ref Range Status  08/03/2024 11 0 - 40 IU/L Final   BUN  Date Value Ref Range Status  08/03/2024 12 8 - 27 mg/dL Final   Calcium   Date Value Ref Range Status  08/03/2024 8.9 8.7 - 10.3 mg/dL Final   CO2  Date Value Ref Range Status  08/03/2024 20 20 - 29 mmol/L Final   Creatinine, Ser  Date Value Ref Range Status  08/03/2024 0.80 0.57 - 1.00 mg/dL Final   Glucose  Date Value Ref Range Status  08/03/2024 246 (H) 70 - 99 mg/dL Final   Glucose, Bld  Date Value Ref Range Status  07/31/2019 131 (H) 70 - 99 mg/dL Final   Potassium  Date Value Ref Range Status  08/03/2024 4.2 3.5 - 5.2 mmol/L Final   Sodium  Date Value Ref  Range Status  08/03/2024 138 134 - 144 mmol/L Final   Bilirubin Total  Date Value Ref Range Status  08/03/2024 <0.2 0.0 - 1.2 mg/dL Final   Protein, ur  Date Value Ref Range Status  07/31/2019 NEGATIVE NEGATIVE mg/dL Final   Protein,UA  Date Value Ref Range Status  06/18/2022 Negative Negative/Trace Final   Total Protein  Date Value Ref Range Status  08/03/2024 5.7 (L) 6.0 - 8.5 g/dL Final   GFR calc Af Amer  Date Value Ref Range Status  06/19/2020 91 >59 mL/min/1.73 Final    Comment:    **[facility] currently reports eGFR in compliance with the current**   recommendations of the [employer]. [facility] will   update reporting as new guidelines are published from the NKF-ASN   Task force.    eGFR  Date Value Ref Range Status  08/03/2024 83 >59 mL/min/1.73 Final   GFR calc non Af Amer  Date Value Ref Range Status  06/19/2020 79 >59 mL/min/1.73 Final

## 2025-03-02 ENCOUNTER — Ambulatory Visit: Admitting: Nurse Practitioner

## 2026-01-17 ENCOUNTER — Ambulatory Visit
